# Patient Record
Sex: Male | Born: 1965 | Race: Black or African American | Hispanic: No | Marital: Married | Smoking: Never smoker
Health system: Southern US, Community
[De-identification: ages and names within clinical notes are randomized; demographics above are authoritative.]

## PROBLEM LIST (undated history)

## (undated) DIAGNOSIS — F329 Major depressive disorder, single episode, unspecified: Secondary | ICD-10-CM

## (undated) DIAGNOSIS — F419 Anxiety disorder, unspecified: Secondary | ICD-10-CM

## (undated) DIAGNOSIS — F32A Depression, unspecified: Secondary | ICD-10-CM

## (undated) DIAGNOSIS — Z889 Allergy status to unspecified drugs, medicaments and biological substances status: Secondary | ICD-10-CM

## (undated) DIAGNOSIS — D649 Anemia, unspecified: Secondary | ICD-10-CM

## (undated) HISTORY — DX: Anemia, unspecified: D64.9

## (undated) HISTORY — DX: Depression, unspecified: F32.A

## (undated) HISTORY — DX: Major depressive disorder, single episode, unspecified: F32.9

## (undated) HISTORY — DX: Anxiety disorder, unspecified: F41.9

## (undated) HISTORY — DX: Allergy status to unspecified drugs, medicaments and biological substances: Z88.9

---
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
## 1991-02-26 HISTORY — PX: TONSILLECTOMY: SUR1361

## 1992-02-26 HISTORY — PX: WISDOM TOOTH EXTRACTION: SHX21

## 2004-01-04 ENCOUNTER — Emergency Department: Payer: Self-pay | Admitting: Unknown Physician Specialty

## 2005-11-01 ENCOUNTER — Emergency Department: Payer: Self-pay

## 2006-04-03 ENCOUNTER — Emergency Department: Payer: Self-pay | Admitting: Internal Medicine

## 2007-03-25 ENCOUNTER — Emergency Department: Payer: Self-pay

## 2008-06-17 ENCOUNTER — Emergency Department: Payer: Self-pay | Admitting: Emergency Medicine

## 2012-10-04 ENCOUNTER — Emergency Department: Payer: Self-pay | Admitting: Internal Medicine

## 2012-10-04 LAB — COMPREHENSIVE METABOLIC PANEL
Anion Gap: 3 — ABNORMAL LOW (ref 7–16)
Bilirubin,Total: 0.3 mg/dL (ref 0.2–1.0)
Calcium, Total: 8.7 mg/dL (ref 8.5–10.1)
Chloride: 107 mmol/L (ref 98–107)
Co2: 29 mmol/L (ref 21–32)
Osmolality: 279 (ref 275–301)
Potassium: 4.1 mmol/L (ref 3.5–5.1)
SGOT(AST): 16 U/L (ref 15–37)
Total Protein: 8 g/dL (ref 6.4–8.2)

## 2012-10-04 LAB — CBC
HCT: 43.3 % (ref 40.0–52.0)
MCH: 29 pg (ref 26.0–34.0)
MCHC: 34.1 g/dL (ref 32.0–36.0)
MCV: 85 fL (ref 80–100)
WBC: 10.5 10*3/uL (ref 3.8–10.6)

## 2012-10-04 LAB — URINALYSIS, COMPLETE
Bacteria: NONE SEEN
Bilirubin,UR: NEGATIVE
Ketone: NEGATIVE
Nitrite: NEGATIVE
Ph: 7 (ref 4.5–8.0)
Protein: NEGATIVE
RBC,UR: 1 /HPF (ref 0–5)
Specific Gravity: 1.018 (ref 1.003–1.030)
Squamous Epithelial: NONE SEEN

## 2012-10-05 ENCOUNTER — Emergency Department: Payer: Self-pay | Admitting: Emergency Medicine

## 2015-02-10 ENCOUNTER — Ambulatory Visit: Payer: Self-pay | Admitting: Family Medicine

## 2015-02-13 ENCOUNTER — Ambulatory Visit: Payer: Self-pay | Admitting: Family Medicine

## 2015-02-15 ENCOUNTER — Telehealth: Payer: Self-pay | Admitting: Family Medicine

## 2015-02-15 NOTE — Telephone Encounter (Signed)
Pt's wife scheduled hospital f/u for 02/16/15. Pt went to [facility] on 02/13/15 and was discharged on 02/14/15 was treated for abdominal pain. Thanks TNP

## 2015-02-15 NOTE — Telephone Encounter (Signed)
FYI. KW 

## 2015-02-16 ENCOUNTER — Encounter: Payer: Self-pay | Admitting: Family Medicine

## 2015-02-16 ENCOUNTER — Ambulatory Visit (INDEPENDENT_AMBULATORY_CARE_PROVIDER_SITE_OTHER): Payer: Medicaid Other | Admitting: Family Medicine

## 2015-02-16 VITALS — HR 95 | Temp 98.4°F | Resp 18 | Wt 260.0 lb

## 2015-02-16 DIAGNOSIS — R1084 Generalized abdominal pain: Secondary | ICD-10-CM | POA: Diagnosis not present

## 2015-02-16 DIAGNOSIS — G5712 Meralgia paresthetica, left lower limb: Secondary | ICD-10-CM | POA: Insufficient documentation

## 2015-02-16 MED ORDER — PREDNISONE 20 MG PO TABS: ORAL_TABLET | ORAL | Status: DC

## 2015-02-16 NOTE — Patient Instructions (Signed)
Try two oxycodone every 6 hours for pain. Add Miralax and/or Dulcolax suppository for constipation. Call me tomorrow with how you are doing.

## 2015-02-16 NOTE — Progress Notes (Signed)
Subjective:     Patient ID: Jack Washington, male   DOB: 10-Apr-1965, 49 y.o.   MRN: [id number]  HPI  Chief Complaint  Patient presents with  . Hospitalization Follow-up    Patient comes in office today fopr hospital follow up. [facility] with complaints of lower abdominal pain radiating to his back for the past 13 days. Patient was sent home same day wiith prescription for Oxycodone, examination at hospital was clear. On 12/19 patient returned [facility] he was d/c on 12/20. Patient states that pain is around entire abdomen described as sharp radiating to his back and now pelvic region. Patient reports being constipated for 3 days.   Was not found to have a specific reason for his abdominal pain despite extensive evaluation including Abd CT scan per patient report. "I feel like it is muscular". Getting minimal relief from oxycodone 5 mg., Bentyl, and flexeril. 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.   Review of Systems  Constitutional: Negative for fever and chills.       Objective:   Physical Exam  Constitutional: He appears well-developed and well-nourished. He appears distressed (moderate pain, holding his left lower abdomen on presentation).  Abdominal: Soft. There is tenderness (generalized). There is no guarding.  Musculoskeletal:  Muscle strength in lower extremities 5/5. SLR's to 45 degrees without radiation of back pain.  Skin:  Hyperesthetic along bilateral lower back. No rash noted.       Assessment:    1. Generalized abdominal pain: ? Inflammatory muscle disorder/severe abdominal wall strain - predniSONE (DELTASONE) 20 MG tablet; Taper as follows: 3 pills for 4 days, two pills for 4 days, one pill for four days  Dispense: 24 tablet; Refill: 0    Plan:    Discussed otc medication for constipation and discussed trying oxycodone @ 10 mg. Every 6 hours. Call tomorrow regarding progress. Work excuse for 12/22-12/29. Suggested he  could stop Bentyl and muscle relaxants if not helping.

## 2015-02-21 ENCOUNTER — Ambulatory Visit (INDEPENDENT_AMBULATORY_CARE_PROVIDER_SITE_OTHER): Payer: Medicaid Other | Admitting: Family Medicine

## 2015-02-21 ENCOUNTER — Encounter: Payer: Self-pay | Admitting: Family Medicine

## 2015-02-21 VITALS — BP 106/80 | HR 72 | Temp 98.2°F | Resp 16 | Wt 269.8 lb

## 2015-02-21 DIAGNOSIS — R1084 Generalized abdominal pain: Secondary | ICD-10-CM

## 2015-02-21 NOTE — Progress Notes (Signed)
Subjective:     Patient ID: Jack Washington, male   DOB: 01-16-1966, 49 y.o.   MRN: [id number]  HPI  Chief Complaint  Patient presents with  . Abdominal Pain    Patient returns to clinic today with complaints of abdominal pain that has got worse since his last office visit on 12/22. Patient describes pain in abdomen as burning and tightness, he denies fever and states that he is back to passing bowel movements but has noticed that he has been passing more gas.   States Miralax has helped him move his bowels daily since I saw him on 12/22. No blood or melena. No new symptoms. Increasing dose of oxycodone to 10 mg. Just made him nauseous. Prednisone has not improved his sx. Subjectively to the examiner appears in less discomfort. Accompanied by his wife and young child.  Review of Systems  Constitutional: Negative for fever and chills.       Objective:   Physical Exam  Constitutional: He appears well-developed and well-nourished. He appears distressed (moderate discomfort).  Abdominal: There is tenderness (generalized). There is no guarding.       Assessment:    1. Generalized abdominal pain: ? IBD ? Internal hernia - Ambulatory referral to Gastroenterology    Plan:    Quick taper off prednisone. Reduce oxycodone back to 5 mg. Every 4-6 hours. Continue Miralax as needed.

## 2015-02-21 NOTE — Patient Instructions (Signed)
Let me know if you need more pain medication prior to your G.I. Visit.

## 2015-02-23 ENCOUNTER — Telehealth: Payer: Self-pay | Admitting: Family Medicine

## 2015-02-23 NOTE — Telephone Encounter (Signed)
Please advise 

## 2015-02-23 NOTE — Telephone Encounter (Signed)
Tammy (pt wife) calling stating pt needs another note to be out of work next week due abdomen pain. Thanks CC

## 2015-02-24 ENCOUNTER — Telehealth: Payer: Self-pay | Admitting: Family Medicine

## 2015-02-24 NOTE — Telephone Encounter (Signed)
Patient Jack Washington(Tammy) wife call and is requesting if you would extend patient's work note, he is still in pain and cannot work. She also states that his colonoscopy is not until February wanted to know if it can be moved up some, she do not think patient came go that long. She was wanting some advice. Cb# [phone] (Tammy)

## 2015-02-24 NOTE — Telephone Encounter (Signed)
Work excuse provided 12/30-1/6. He was provided a new medication per Tammy but has not started it yet.Will see how this works. If no improvement may need to push up colonoscopy time.

## 2015-03-07 ENCOUNTER — Telehealth: Payer: Self-pay

## 2015-03-07 NOTE — Telephone Encounter (Signed)
Tried calling [facility] dept [phone]( [phone]) 2 times and line was busy. Will try again later.

## 2015-03-07 NOTE — Telephone Encounter (Signed)
Would like to see the [facility] consult from 12/29 first.Can you ask them to fax it to us.

## 2015-03-07 NOTE — Telephone Encounter (Signed)
Patient called wanting to know if he can have an extension before he goes back to work. Patient is wanting to start back on next week 03/13/15. Ok to write note? Please advise. Thanks!

## 2015-03-08 NOTE — Telephone Encounter (Signed)
Awaiting fax report.KW

## 2015-03-08 NOTE — Telephone Encounter (Signed)
Spoke with Lupita Leashonna at [facility]. Per Lupita Leashonna she will send a message to the nurse to fax consult to us.

## 2015-03-09 ENCOUNTER — Telehealth: Payer: Self-pay

## 2015-03-09 NOTE — Telephone Encounter (Signed)
-----   Message from Anola Gurneyobert Chauvin, [place] sent at 03/08/2015  1:20 PM EST ----- Let him know work excuse is up front for pickup.

## 2015-03-09 NOTE — Telephone Encounter (Signed)
Voicemail box was full, sent SMS to phone for patient to call our number back. KW

## 2015-03-10 NOTE — Telephone Encounter (Signed)
Patient voicemail box is full sent SMS message to phone to contact office at [phone].KW

## 2015-03-10 NOTE — Telephone Encounter (Signed)
Patients wife called back office and I advised her that work not is available to pick up at front desk, she states that she will be picking up note for patient this afternoon. KW

## 2015-03-17 ENCOUNTER — Telehealth: Payer: Self-pay | Admitting: Emergency Medicine

## 2015-03-17 NOTE — Telephone Encounter (Signed)
[id number] 

## 2015-03-17 NOTE — Telephone Encounter (Signed)
Work excuse for 1/16-1/20 is up front for pickup. When is the colonoscopy scheduled?

## 2015-03-17 NOTE — Telephone Encounter (Signed)
Pt wife and needs his work excuse for this week. She said you knew about this and had been doing it for pt. Thanks.    CB#[phone]

## 2015-03-20 NOTE — Telephone Encounter (Signed)
[id number] 

## 2015-03-21 NOTE — Telephone Encounter (Signed)
LMTCB 03/21/2015  Thanks,   -Donyelle Enyeart  

## 2015-03-22 NOTE — Telephone Encounter (Signed)
NA. sd 

## 2015-03-24 NOTE — Telephone Encounter (Signed)
Unable to reach patient after multiple times, will mail letter to home. KW

## 2015-03-31 ENCOUNTER — Encounter: Payer: Self-pay | Admitting: *Deleted

## 2015-04-03 ENCOUNTER — Encounter: Payer: Self-pay | Admitting: Anesthesiology

## 2015-04-03 ENCOUNTER — Ambulatory Visit: Payer: Medicaid Other | Admitting: Anesthesiology

## 2015-04-03 ENCOUNTER — Ambulatory Visit
Admission: RE | Admit: 2015-04-03 | Discharge: 2015-04-03 | Disposition: A | Discharge status: Home / Self Care | Payer: Medicaid Other | Source: Ambulatory Visit | Attending: Gastroenterology | Admitting: Gastroenterology

## 2015-04-03 ENCOUNTER — Encounter
Admission: RE | Disposition: A | Discharge status: Home / Self Care | Payer: Self-pay | Source: Ambulatory Visit | Attending: Gastroenterology

## 2015-04-03 DIAGNOSIS — K5730 Diverticulosis of large intestine without perforation or abscess without bleeding: Secondary | ICD-10-CM | POA: Insufficient documentation

## 2015-04-03 DIAGNOSIS — G709 Myoneural disorder, unspecified: Secondary | ICD-10-CM | POA: Insufficient documentation

## 2015-04-03 DIAGNOSIS — Z7952 Long term (current) use of systemic steroids: Secondary | ICD-10-CM | POA: Insufficient documentation

## 2015-04-03 DIAGNOSIS — Z800 Family history of malignant neoplasm of digestive organs: Secondary | ICD-10-CM | POA: Diagnosis not present

## 2015-04-03 DIAGNOSIS — E669 Obesity, unspecified: Secondary | ICD-10-CM | POA: Insufficient documentation

## 2015-04-03 DIAGNOSIS — R1084 Generalized abdominal pain: Secondary | ICD-10-CM | POA: Diagnosis present

## 2015-04-03 DIAGNOSIS — F419 Anxiety disorder, unspecified: Secondary | ICD-10-CM | POA: Diagnosis not present

## 2015-04-03 DIAGNOSIS — Z79899 Other long term (current) drug therapy: Secondary | ICD-10-CM | POA: Insufficient documentation

## 2015-04-03 DIAGNOSIS — Z6841 Body Mass Index (BMI) 40.0 and over, adult: Secondary | ICD-10-CM | POA: Insufficient documentation

## 2015-04-03 DIAGNOSIS — F329 Major depressive disorder, single episode, unspecified: Secondary | ICD-10-CM | POA: Diagnosis not present

## 2015-04-03 DIAGNOSIS — K640 First degree hemorrhoids: Secondary | ICD-10-CM | POA: Insufficient documentation

## 2015-04-03 DIAGNOSIS — R1030 Lower abdominal pain, unspecified: Secondary | ICD-10-CM | POA: Insufficient documentation

## 2015-04-03 HISTORY — PX: COLONOSCOPY WITH PROPOFOL: SHX5780

## 2015-04-03 SURGERY — COLONOSCOPY WITH PROPOFOL
Anesthesia: General

## 2015-04-03 MED ORDER — PROPOFOL 500 MG/50ML IV EMUL
INTRAVENOUS | Status: DC | PRN
  Administered 2015-04-03: 150 ug/kg/min via INTRAVENOUS

## 2015-04-03 MED ORDER — SODIUM CHLORIDE 0.9 % IV SOLN: INTRAVENOUS | Status: DC

## 2015-04-03 MED ORDER — PROPOFOL 10 MG/ML IV BOLUS
INTRAVENOUS | Status: DC | PRN
  Administered 2015-04-03: 100 mg via INTRAVENOUS

## 2015-04-03 MED ORDER — SODIUM CHLORIDE 0.9 % IV SOLN
INTRAVENOUS | Status: DC
  Administered 2015-04-03: 1000 mL via INTRAVENOUS

## 2015-04-03 NOTE — Anesthesia Preprocedure Evaluation (Signed)
Anesthesia Evaluation  Patient identified by MRN, date of birth, ID band Patient awake    Reviewed: Allergy & Precautions, H&P , NPO status , Patient's Chart, lab work & pertinent test results  History of Anesthesia Complications Negative for: history of anesthetic complications  Airway Mallampati: III  TM Distance: >3 FB Neck ROM: full    Dental  (+) Poor Dentition   Pulmonary neg pulmonary ROS, neg shortness of breath,    Pulmonary exam normal breath sounds clear to auscultation       Cardiovascular Exercise Tolerance: Good (-) angina(-) Past MI and (-) DOE Normal cardiovascular exam Rhythm:regular Rate:Normal     Neuro/Psych PSYCHIATRIC DISORDERS Anxiety Depression  Neuromuscular disease    GI/Hepatic negative GI ROS, Neg liver ROS, neg GERD  ,  Endo/Other  negative endocrine ROS  Renal/GU negative Renal ROS  negative genitourinary   Musculoskeletal   Abdominal   Peds  Hematology negative hematology ROS (+)   Anesthesia Other Findings Past Medical History:   History of allergy                                           Anemia                                                       Depression                                                   Anxiety                                                     Signs and symptoms suggestive of sleep apnea    Past Surgical History:   APPENDECTOMY                                     1985         WISDOM TOOTH EXTRACTION                          1994         TONSILLECTOMY                                    1993        BMI    Body Mass Index   41.06 kg/m 2      Reproductive/Obstetrics negative OB ROS                             Anesthesia Physical Anesthesia Plan  ASA: III  Anesthesia Plan: General   Post-op Pain Management:    Induction:   Airway Management Planned:   Additional Equipment:   Intra-op  Plan:   Post-operative Plan:    Informed Consent: I have reviewed the patients History and Physical, chart, labs and discussed the procedure including the risks, benefits and alternatives for the proposed anesthesia with the patient or authorized representative who has indicated his/her understanding and acceptance.   Dental Advisory Given  Plan Discussed with: Anesthesiologist, CRNA and Surgeon  Anesthesia Plan Comments:         Anesthesia Quick Evaluation

## 2015-04-03 NOTE — Transfer of Care (Signed)
Immediate Anesthesia Transfer of Care Note  Patient: Jack Washington  Procedure(s) Performed: Procedure(s): COLONOSCOPY WITH PROPOFOL (N/A)  Patient Location: Endoscopy Unit  Anesthesia Type:General  Level of Consciousness: sedated  Airway & Oxygen Therapy: Patient Spontanous Breathing and Patient connected to nasal cannula oxygen  Post-op Assessment: Report given to RN and Post -op Vital signs reviewed and stable  Post vital signs: Reviewed and stable  Last Vitals:  Filed Vitals:   04/03/15 0748  BP: 132/100  Pulse: 98  Temp: 36.3 C  Resp: 16    Complications: No apparent anesthesia complications

## 2015-04-03 NOTE — Anesthesia Postprocedure Evaluation (Deleted)
Anesthesia Post Note  Patient: Jack Washington  Procedure(s) Performed: Procedure(s) (LRB): COLONOSCOPY WITH PROPOFOL (N/A)  Patient location during evaluation: Endoscopy Anesthesia Type: General Level of consciousness: awake and alert Pain management: pain level controlled Vital Signs Assessment: post-procedure vital signs reviewed and stable Respiratory status: spontaneous breathing, nonlabored ventilation, respiratory function stable and patient connected to nasal cannula oxygen Cardiovascular status: blood pressure returned to baseline and stable Postop Assessment: no signs of nausea or vomiting Anesthetic complications: no    Last Vitals:  Filed Vitals:   04/03/15 0748 04/03/15 0856  BP: 132/100   Pulse: 98   Temp: 36.3 C 36 C  Resp: 16     Last Pain:  Filed Vitals:   04/03/15 0858  PainSc: Asleep                 Cleda Mccreedy Jerimie Mancuso

## 2015-04-03 NOTE — Anesthesia Postprocedure Evaluation (Signed)
Anesthesia Post Note  Patient: Jack Washington  Procedure(s) Performed: Procedure(s) (LRB): COLONOSCOPY WITH PROPOFOL (N/A)  Patient location during evaluation: Endoscopy Anesthesia Type: General Level of consciousness: awake and alert Pain management: pain level controlled Vital Signs Assessment: post-procedure vital signs reviewed and stable Respiratory status: spontaneous breathing, nonlabored ventilation, respiratory function stable and patient connected to nasal cannula oxygen Cardiovascular status: blood pressure returned to baseline and stable Postop Assessment: no signs of nausea or vomiting Anesthetic complications: no    Last Vitals:  Filed Vitals:   04/03/15 0926 04/03/15 0936  BP: 121/105 136/99  Pulse: 81 76  Temp:    Resp: 10 14    Last Pain:  Filed Vitals:   04/03/15 0950  PainSc: Asleep                 Cleda Mccreedy Aisha Greenberger

## 2015-04-03 NOTE — H&P (Signed)
Outpatient short stay form Pre-procedure 04/03/2015 8:25 AM Christena Deem MD  Primary Physician: Dr. Aram Beecham  Reason for visit:  Colonoscopy  History of present illness:  Patient is a 50 year old male presenting today for colonoscopy. He has had no change of bowel habits however for the past 6-8 weeks he has had intermittent problems with generalized abdominal pain. This may at times awaken him from sleeping. He does have a family history of colon cancer primary relative. He's never had a colonoscopy before. There is no nausea or vomiting. The discomfort is basically in the lower abdomen. It seems to start on the flanks and radiated inward. It is not epigastric. He is seen no black stool or blood in the stool.    Current facility-administered medications:  .  0.9 %  sodium chloride infusion, , Intravenous, Continuous, Christena Deem, MD, Last Rate: 20 mL/hr at 04/03/15 0801, 1,000 mL at 04/03/15 0801 .  0.9 %  sodium chloride infusion, , Intravenous, Continuous, Christena Deem, MD  Prescriptions prior to admission  Medication Sig Dispense Refill Last Dose  . amitriptyline (ELAVIL) 25 MG tablet Take 25 mg by mouth at bedtime as needed for sleep.   04/02/2015 at Unknown time  . cyclobenzaprine (FLEXERIL) 10 MG tablet Take by mouth.   04/02/2015 at Unknown time  . dicyclomine (BENTYL) 20 MG tablet Take by mouth.   04/02/2015 at Unknown time  . oxyCODONE-acetaminophen (PERCOCET/ROXICET) 5-325 MG tablet TK 1 T PO  Q 4 H PRN P FOR UP TO 7 DAYS  0 04/02/2015 at Unknown time  . predniSONE (DELTASONE) 20 MG tablet Taper as follows: 3 pills for 4 days, two pills for 4 days, one pill for four days (Patient not taking: Reported on 04/03/2015) 24 tablet 0 Completed Course     Allergies  Allergen Reactions  . Sulfa Antibiotics      Past Medical History  Diagnosis Date  . History of allergy   . Anemia   . Depression   . Anxiety     Review of systems:      Physical Exam    Heart and  lungs: Regular rate and rhythm without rub or gallop, lungs are bilaterally clear.    HEENT: Norm cephalic atraumatic eyes are anicteric    Other:     Pertinant exam for procedure: Soft obese, mild discomfort generally mostly in the lower abdomen or no masses or rebound. Bowel sounds positive normoactive.    Planned proceedures: Colonoscopy and indicated procedures. I have discussed the risks benefits and complications of procedures to include not limited to bleeding, infection, perforation and the risk of sedation and the patient wishes to proceed.    Christena Deem, MD Gastroenterology 04/03/2015  8:25 AM

## 2015-04-03 NOTE — Op Note (Signed)
[facility] Gastroenterology Patient Name: Jack Washington Procedure Date: 04/03/2015 8:27 AM MRN: [id number] Account #: [id number] Date of Birth: 1965/05/20 Admit Type: Outpatient Age: 50 Room: [facility] ENDO ROOM 3 Gender: Male Note Status: Finalized Procedure:         Colonoscopy Indications:       Generalized abdominal pain, Lower abdominal pain, Family                     history of colon cancer in a first-degree relative Providers:         Christena Deem, MD Referring MD:      Hassell Halim, MD (Referring MD) Medicines:         Monitored Anesthesia Care Complications:     No immediate complications. Procedure:         Pre-Anesthesia Assessment:                    - ASA Grade Assessment: III - A patient with severe                     systemic disease.                    After obtaining informed consent, the colonoscope was                     passed under direct vision. Throughout the procedure, the                     patient's blood pressure, pulse, and oxygen saturations                     were monitored continuously. The Colonoscope was                     introduced through the anus and advanced to the the cecum,                     identified by appendiceal orifice and ileocecal valve. The                     colonoscopy was performed with moderate difficulty.                     Successful completion of the procedure was aided by                     changing the patient to a prone position and using manual                     pressure. The quality of the bowel preparation was good. Findings:      Multiple medium-mouthed diverticula were found in the sigmoid colon and       in the descending colon.      Multiple medium-mouthed diverticula were found in the proximal sigmoid       colon and in the mid sigmoid colon. Petechia were visualized in       association with the diverticular opening.      The digital rectal exam was normal.   Non-bleeding internal hemorrhoids were found during retroflexion. The       hemorrhoids were Grade I (internal hemorrhoids that do not prolapse).      No additional abnormalities were found on retroflexion. Impression:        -  Diverticulosis in the sigmoid colon and in the                     descending colon.                    - Mild diverticulosis in the proximal sigmoid colon and in                     the mid sigmoid colon. Petechia were visualized in                     association with the diverticular opening.                    - Non-bleeding internal hemorrhoids.                    - No specimens collected. Recommendation:    - Discharge patient to home.                    - Cipro (ciprofloxacin) 500 mg PO BID for 1 week.                    - Flagyl (metronidazole) 250 mg PO QID for 1 week.                    - Return to GI clinic in 1 month. Procedure Code(s): --- Professional ---                    [phone], Colonoscopy, flexible; diagnostic, including                     collection of specimen(s) by brushing or washing, when                     performed (separate procedure) Diagnosis Code(s): --- Professional ---                    K64.0, First degree hemorrhoids                    R10.84, Generalized abdominal pain                    R10.30, Lower abdominal pain, unspecified                    Z80.0, Family history of malignant neoplasm of digestive                     organs                    K57.30, Diverticulosis of large intestine without                     perforation or abscess without bleeding CPT copyright 2014 American Medical Association. All rights reserved. The codes documented in this report are preliminary and upon coder review may  be revised to meet current compliance requirements. Christena Deem, MD 04/03/2015 8:55:23 AM This report has been signed electronically. Number of Addenda: 0 Note Initiated On: 04/03/2015 8:27 AM Scope Withdrawal Time: 0  hours 4 minutes 20 seconds  Total Procedure Duration: 0 hours 14 minutes 34 seconds       [facility]

## 2015-04-04 ENCOUNTER — Encounter: Payer: Self-pay | Admitting: Gastroenterology

## 2015-04-13 ENCOUNTER — Encounter: Payer: Medicaid Other | Admitting: Physician Assistant

## 2015-04-13 ENCOUNTER — Encounter: Payer: Self-pay | Admitting: Physician Assistant

## 2015-04-13 ENCOUNTER — Encounter: Payer: Self-pay | Admitting: Family Medicine

## 2015-04-13 NOTE — Progress Notes (Deleted)
Patient ID: Jack Washington, male   DOB: [birth year], 50 y.o.   MRN: [id number]       Patient: Jack Washington Male    DOB: 04/02/1965   50 y.o.   MRN: [id number] Visit Date: 04/13/2015  Today's Provider: Margaretann Loveless, PA-C   Chief Complaint  Patient presents with  . Diverticulosis    Follow up from Colonoscopy with Dr. Barnetta Chapel   Subjective:    Abdominal Pain This is a recurrent problem. Associated symptoms include belching, constipation, flatus and nausea. Nothing aggravates the pain. The pain is relieved by passing flatus. He has tried antibiotics for the symptoms.  Patient returns to office today for follow up, patient had scheduled colonoscopy on 04/03/15. Colonoscopy showed diverticulosis in the sigmoid colon and in the descending colon. Non bleeding internal hemorrhoids were found during retroflexion, patient was prescribed Cipro  and Flagyl .      Allergies  Allergen Reactions  . Sulfa Antibiotics    Previous Medications   AMITRIPTYLINE (ELAVIL) 25 MG TABLET    Take 25 mg by mouth at bedtime as needed for sleep.   CYCLOBENZAPRINE (FLEXERIL) 10 MG TABLET    Take by mouth.   DICYCLOMINE (BENTYL) 20 MG TABLET    Take by mouth.   METRONIDAZOLE (FLAGYL) 250 MG TABLET       OXYCODONE-ACETAMINOPHEN (PERCOCET/ROXICET) 5-325 MG TABLET    TK 1 T PO  Q 4 H PRN P FOR UP TO 7 DAYS   POLYETHYLENE GLYCOL POWDER (GLYCOLAX/MIRALAX) POWDER       PREDNISONE (DELTASONE) 20 MG TABLET    Taper as follows: 3 pills for 4 days, two pills for 4 days, one pill for four days    Review of Systems  Gastrointestinal: Positive for nausea, abdominal pain, constipation and flatus.    Social History  Substance Use Topics  . Smoking status: Never Smoker   . Smokeless tobacco: Never Used  . Alcohol Use: No   Objective:   There were no vitals taken for this visit.  Physical Exam      Assessment & Plan:           Margaretann Loveless, PA-C  [facility]

## 2015-04-13 NOTE — Progress Notes (Deleted)
Subjective:     Patient ID: Jack Washington, male   DOB: [birth year], 50 y.o.   MRN: [id number]  HPI   Review of Systems     Objective:   Physical Exam     Assessment:     ***    Plan:     ***

## 2015-04-14 ENCOUNTER — Encounter: Payer: Self-pay | Admitting: Family Medicine

## 2015-04-14 ENCOUNTER — Ambulatory Visit (INDEPENDENT_AMBULATORY_CARE_PROVIDER_SITE_OTHER): Payer: Medicaid Other | Admitting: Family Medicine

## 2015-04-14 VITALS — BP 120/76 | HR 88 | Temp 98.0°F | Resp 16 | Wt 272.8 lb

## 2015-04-14 DIAGNOSIS — F430 Acute stress reaction: Secondary | ICD-10-CM | POA: Diagnosis not present

## 2015-04-14 DIAGNOSIS — R1084 Generalized abdominal pain: Secondary | ICD-10-CM | POA: Diagnosis not present

## 2015-04-14 MED ORDER — DIAZEPAM 5 MG PO TABS: 5.0000 mg | ORAL_TABLET | Freq: Three times a day (TID) | ORAL | Status: DC | PRN

## 2015-04-14 NOTE — Patient Instructions (Signed)
F/u with G.I.as scheduled. Show him the results of you stool tests.

## 2015-04-14 NOTE — Progress Notes (Signed)
Subjective:     Patient ID: Jack Washington, male   DOB: 1966-01-13, 50 y.o.   MRN: [id number]  HPI  Chief Complaint  Patient presents with  . Follow-up    Patient comes in office today for follow up visit, patient still has complaints of abdominal pain today. Patient recently had colonoscopy with Dr.Skulskie on 04/03/15, findings from the colonoscopy showed multiple medium diverticula which was found in the proximal sigmoid. Impresion from exam showed diverticulosis in the sigmoid colon and in the descending colon and non bleeding internal hemorrhoids were found during retroflexion. Patiet was treated with Cipro  and Flagyl   States he has f/u with G.I. Dr. Ricki Rodriguez, in early March. Also noted ?of worms in his stool. He took a sample to a vet and was noted to have a tapeworm and Giardia cysts. He will submit these results to G.I.when he sees them. In general intensity of abdominal pain has decreased since onset in early December of last year. He now requires Tyenol for discomfort. Notes stress will exacerbate (family member ill and taxes) and feels at times it radiates from his back to both sides of his abdomen. Accompanied by his wife today.   Review of Systems     Objective:   Physical Exam  Constitutional: He appears well-developed and well-nourished. No distress.  Abdominal: Soft. There is no tenderness.  Musculoskeletal:  SLR's without back pain to 75 degrees.       Assessment:    1. Generalized abdominal pain  2. Reaction, situational, acute, to stress: Will see if this medication ameliorates his abdominal pain. - diazepam (VALIUM) 5 MG tablet; Take 1 tablet (5 mg total) by mouth every 8 (eight) hours as needed for anxiety.  Dispense: 30 tablet; Refill: 0    Plan:    F/u with G.I.as scheduled and complete abx.

## 2015-04-14 NOTE — Progress Notes (Signed)
A user error has taken place: encounter opened in error, closed for administrative reasons.

## 2015-05-02 ENCOUNTER — Telehealth: Payer: Self-pay | Admitting: Family Medicine

## 2015-05-02 NOTE — Telephone Encounter (Signed)
Pt's wife Tammy stated pt was supposed to call Nadine CountsBob after his GI appt. Tammy wanted to let Nadine CountsBob know they went to the appt and Nadine CountsBob can call pt when he gets a chance. Thanks TNP

## 2015-05-02 NOTE — Telephone Encounter (Signed)
FYI

## 2015-05-03 ENCOUNTER — Other Ambulatory Visit: Payer: Self-pay | Admitting: Family Medicine

## 2015-05-03 ENCOUNTER — Telehealth: Payer: Self-pay

## 2015-05-03 ENCOUNTER — Encounter: Payer: Self-pay | Admitting: Family Medicine

## 2015-05-03 DIAGNOSIS — F439 Reaction to severe stress, unspecified: Secondary | ICD-10-CM

## 2015-05-03 DIAGNOSIS — K589 Irritable bowel syndrome without diarrhea: Secondary | ICD-10-CM | POA: Insufficient documentation

## 2015-05-03 MED ORDER — ESCITALOPRAM OXALATE 10 MG PO TABS: 10.0000 mg | ORAL_TABLET | Freq: Every day | ORAL | Status: DC

## 2015-05-03 NOTE — Telephone Encounter (Signed)
Discussed: G.I.note pending.

## 2015-05-03 NOTE — Telephone Encounter (Signed)
[id number] 

## 2015-05-03 NOTE — Telephone Encounter (Signed)
-----   Message from Anola Gurneyobert Chauvin, [place] sent at 05/03/2015 10:43 AM EST ----- Let him know I have reviewed the G.I. Note. I have sent in a prescription for generic Lexapro. Start at 1/2 pill for the first week. I would want to see him in 2-3 weeks. This medication will help with stress and may help calm down his stomach as well. You can continue to use diazepam while we get you started on Lexapro.

## 2015-05-05 NOTE — Telephone Encounter (Signed)
Patient has been advised. KW 

## 2015-05-11 ENCOUNTER — Telehealth: Payer: Self-pay | Admitting: Family Medicine

## 2015-05-11 NOTE — Telephone Encounter (Signed)
Discussed with patient. Letter completed stating he can return to full duty.

## 2015-05-11 NOTE — Telephone Encounter (Signed)
Pt and pt's brother called and would like a letter on office letter head stating that pt is on light duty. Pt is requesting the letter so he can get unemployment. Pt's brother stated they needed the letter today b/c they haven't worked in 3 months and it would take another 6 weeks to get the check. I advised that it might not be available to be done today. Please advise. Thanks TNP

## 2015-05-11 NOTE — Telephone Encounter (Signed)
Please Review. KW 

## 2018-03-02 ENCOUNTER — Encounter: Payer: Self-pay | Admitting: Emergency Medicine

## 2018-03-02 ENCOUNTER — Emergency Department
Admission: EM | Admit: 2018-03-02 | Discharge: 2018-03-02 | Disposition: A | Discharge status: Home / Self Care | Payer: Self-pay | Attending: Emergency Medicine | Admitting: Emergency Medicine

## 2018-03-02 ENCOUNTER — Other Ambulatory Visit: Payer: Self-pay

## 2018-03-02 DIAGNOSIS — Y990 Civilian activity done for income or pay: Secondary | ICD-10-CM | POA: Insufficient documentation

## 2018-03-02 DIAGNOSIS — X500XXA Overexertion from strenuous movement or load, initial encounter: Secondary | ICD-10-CM | POA: Insufficient documentation

## 2018-03-02 DIAGNOSIS — S29012A Strain of muscle and tendon of back wall of thorax, initial encounter: Secondary | ICD-10-CM | POA: Insufficient documentation

## 2018-03-02 DIAGNOSIS — Z79899 Other long term (current) drug therapy: Secondary | ICD-10-CM | POA: Insufficient documentation

## 2018-03-02 DIAGNOSIS — Y9289 Other specified places as the place of occurrence of the external cause: Secondary | ICD-10-CM | POA: Insufficient documentation

## 2018-03-02 DIAGNOSIS — S46912A Strain of unspecified muscle, fascia and tendon at shoulder and upper arm level, left arm, initial encounter: Secondary | ICD-10-CM

## 2018-03-02 DIAGNOSIS — Y9389 Activity, other specified: Secondary | ICD-10-CM | POA: Insufficient documentation

## 2018-03-02 MED ORDER — LIDOCAINE 5 % EX PTCH: 1.0000 | MEDICATED_PATCH | Freq: Two times a day (BID) | CUTANEOUS | 0 refills | Status: DC

## 2018-03-02 MED ORDER — OXYCODONE-ACETAMINOPHEN 7.5-325 MG PO TABS: 1.0000 | ORAL_TABLET | Freq: Four times a day (QID) | ORAL | 0 refills | Status: DC | PRN

## 2018-03-02 MED ORDER — METHOCARBAMOL 750 MG PO TABS: 750.0000 mg | ORAL_TABLET | Freq: Four times a day (QID) | ORAL | 0 refills | Status: DC

## 2018-03-02 NOTE — ED Notes (Signed)
See triage note  Presents with pain to upper back  Pain is mainly left posterior shoulder and increases with movement and inspiration  States it started on Friday after pulling something at work

## 2018-03-02 NOTE — ED Provider Notes (Signed)
[facility] Emergency Department Provider Note   ____________________________________________   First MD Initiated Contact with Patient 03/02/18 1133     (approximate)  I have reviewed the triage vital signs and the nursing notes.   HISTORY  Chief Complaint Back Pain    HPI Jack Washington is a 53 y.o. male patient presents with 3 days of left upper back pain.  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  Patient rates his pain as 8/10.  Patient described pain is "achy".  No relief over-the-counter pain patches and anti-inflammatory medications.  Past Medical History:  Diagnosis Date  . Anemia   . Anxiety   . Depression   . History of allergy     Patient Active Problem List   Diagnosis Date Noted  . IBS (irritable bowel syndrome) 05/03/2015  . Generalized abdominal pain 04/14/2015  . Meralgia paresthetica of left side 02/16/2015    Past Surgical History:  Procedure Laterality Date  . APPENDECTOMY  1985  . COLONOSCOPY WITH PROPOFOL N/A 04/03/2015   Procedure: COLONOSCOPY WITH PROPOFOL;  Surgeon: Christena DeemMartin U Skulskie, MD;  Location: [facility] ENDOSCOPY;  Service: Endoscopy;  Laterality: N/A;  . TONSILLECTOMY  1993  . WISDOM TOOTH EXTRACTION  1994    Prior to Admission medications   Medication Sig Start Date End Date Taking? Authorizing Provider  diazepam (VALIUM) 5 MG tablet Take 1 tablet (5 mg total) by mouth every 8 (eight) hours as needed for anxiety. 04/14/15   Anola Gurneyhauvin, Robert, PA  escitalopram (LEXAPRO) 10 MG tablet Take 1 tablet (10 mg total) by mouth daily. Start at 1/2 pill for the first week 05/03/15   Anola Gurneyhauvin, Robert, PA  lidocaine (LIDODERM) 5 % Place 1 patch onto the skin every 12 (twelve) hours. Remove & Discard patch within 12 hours or as directed by MD 03/02/18 03/02/19  Joni ReiningSmith, Ronald K, PA-C  methocarbamol (ROBAXIN-750) 750 MG tablet Take 1 tablet (750 mg total) by mouth 4 (four) times daily. 03/02/18    Joni ReiningSmith, Ronald K, PA-C  metroNIDAZOLE (FLAGYL) 250 MG tablet  04/03/15   [provider]  oxyCODONE-acetaminophen (PERCOCET) 7.5-325 MG tablet Take 1 tablet by mouth every 6 (six) hours as needed for severe pain. 03/02/18   Joni ReiningSmith, Ronald K, PA-C  polyethylene glycol powder [facility](GLYCOLAX/MIRALAX) powder  02/23/15   [provider]    Allergies Amitriptyline and Sulfa antibiotics  Family History  Problem Relation Age of Onset  . Panic disorder Mother   . Healthy Daughter   . Healthy Son   . Heart disease Maternal Grandmother        had stent  . Heart failure Maternal Grandmother   . Cancer Paternal Grandfather   . Healthy Son     Social History Social History   Tobacco Use  . Smoking status: Never Smoker  . Smokeless tobacco: Never Used  Substance Use Topics  . Alcohol use: No    Alcohol/week: 0.0 standard drinks  . Drug use: Not on file    Review of Systems  Constitutional: No fever/chills Eyes: No visual changes. ENT: No sore throat. Cardiovascular: Denies chest pain. Respiratory: Denies shortness of breath. Gastrointestinal: No abdominal pain.  No nausea, no vomiting.  No diarrhea.  No constipation. Genitourinary: Negative for dysuria. Musculoskeletal: Left upper back pain. Skin: Negative for rash. Neurological: Negative for headaches, focal weakness or numbness. Psychiatric:Anxiety depression. Endocrine: Allergic/Immunilogical: Amitriptyline and sulfa medications. ____________________________________________   PHYSICAL EXAM:  VITAL SIGNS:  ED Triage Vitals  Enc Vitals Group     BP 03/02/18 1104 (!) 161/98     Pulse Rate 03/02/18 1104 98     Resp 03/02/18 1104 16     Temp 03/02/18 1104 98.8 F (37.1 C)     Temp Source 03/02/18 1104 Oral     SpO2 03/02/18 1104 96 %     Weight 03/02/18 1102 270 lb (122.5 kg)     Height 03/02/18 1102 5\' 8"  (1.727 m)     Head Circumference --      Peak Flow --      Pain Score 03/02/18 1101 8     Pain Loc --       Pain Edu? --      Excl. in GC? --    Constitutional: Alert and oriented. Well appearing and in no acute distress. Neck: No stridor.  No cervical spine tenderness to palpation. Hematological/Lymphatic/Immunilogical: No cervical lymphadenopathy. Cardiovascular: Normal rate, regular rhythm. Grossly normal heart sounds.  Good peripheral circulation. Respiratory: Normal respiratory effort.  No retractions. Lungs CTAB. Musculoskeletal: No obvious deformity to the left upper extremity.  Patient has decreased range of motion abduction overhead reaching left upper extremity.  Moderate guarding palpation left mid scapulary muscle area. Neurologic:  Normal speech and language. No gross focal neurologic deficits are appreciated. No gait instability. Skin:  Skin is warm, dry and intact. No rash noted. Psychiatric: Mood and affect are normal. Speech and behavior are normal.  ____________________________________________   LABS (all labs ordered are listed, but only abnormal results are displayed)  Labs Reviewed - No data to display ____________________________________________  EKG   ____________________________________________  RADIOLOGY  ED MD interpretation:    Official radiology report(s): No results found.  ____________________________________________   PROCEDURES  Procedure(s) performed: None  Procedures  Critical Care performed: No  ____________________________________________   INITIAL IMPRESSION / ASSESSMENT AND PLAN / ED COURSE  As part of my medical decision making, I reviewed the following data within the electronic MEDICAL RECORD NUMBER    Upper back pain secondary to scapular strain.  Patient given discharge care instruction.  Patient advised take medication as directed.  Patient given a work note advised no driving while taking pain medications.  Advised to follow-up PCP if condition persist.      ____________________________________________   FINAL  CLINICAL IMPRESSION(S) / ED DIAGNOSES  Final diagnoses:  Muscle strain of left scapular region, initial encounter     ED Discharge Orders         Ordered    methocarbamol (ROBAXIN-750) 750 MG tablet  4 times daily     03/02/18 1155    lidocaine (LIDODERM) 5 %  Every 12 hours     03/02/18 1155    oxyCODONE-acetaminophen (PERCOCET) 7.5-325 MG tablet  Every 6 hours PRN     03/02/18 1155           Note:  This document was prepared using Dragon voice recognition software and may include unintentional dictation errors.    Joni Reining, PA-C 03/02/18 1200    Sharman Cheek, MD 03/09/18 [phone]

## 2018-07-27 ENCOUNTER — Other Ambulatory Visit: Payer: Self-pay

## 2018-07-27 ENCOUNTER — Telehealth: Payer: Self-pay | Admitting: *Deleted

## 2018-07-27 DIAGNOSIS — Z20822 Contact with and (suspected) exposure to covid-19: Secondary | ICD-10-CM

## 2018-07-27 NOTE — Telephone Encounter (Signed)
[facility] Dept  Request testing due to + exposure in home and symptoms- fever, fatigue

## 2018-07-28 ENCOUNTER — Other Ambulatory Visit: Payer: Self-pay | Admitting: *Deleted

## 2018-07-28 DIAGNOSIS — Z20822 Contact with and (suspected) exposure to covid-19: Secondary | ICD-10-CM

## 2018-07-30 LAB — NOVEL CORONAVIRUS, NAA: SARS-CoV-2, NAA: DETECTED — AB

## 2018-07-31 ENCOUNTER — Encounter: Payer: Self-pay | Admitting: Emergency Medicine

## 2018-07-31 ENCOUNTER — Emergency Department
Admission: EM | Admit: 2018-07-31 | Discharge: 2018-07-31 | Disposition: A | Discharge status: Home / Self Care | Payer: Self-pay | Attending: Emergency Medicine | Admitting: Emergency Medicine

## 2018-07-31 ENCOUNTER — Other Ambulatory Visit: Payer: Self-pay

## 2018-07-31 ENCOUNTER — Emergency Department: Payer: Self-pay

## 2018-07-31 DIAGNOSIS — W57XXXA Bitten or stung by nonvenomous insect and other nonvenomous arthropods, initial encounter: Secondary | ICD-10-CM

## 2018-07-31 DIAGNOSIS — U071 COVID-19: Secondary | ICD-10-CM | POA: Insufficient documentation

## 2018-07-31 DIAGNOSIS — Z79899 Other long term (current) drug therapy: Secondary | ICD-10-CM | POA: Insufficient documentation

## 2018-07-31 DIAGNOSIS — I10 Essential (primary) hypertension: Secondary | ICD-10-CM | POA: Insufficient documentation

## 2018-07-31 DIAGNOSIS — Z20818 Contact with and (suspected) exposure to other bacterial communicable diseases: Secondary | ICD-10-CM | POA: Insufficient documentation

## 2018-07-31 LAB — COMPREHENSIVE METABOLIC PANEL
ALT: 26 U/L (ref 0–44)
AST: 23 U/L (ref 15–41)
Albumin: 3.9 g/dL (ref 3.5–5.0)
Alkaline Phosphatase: 39 U/L (ref 38–126)
Anion gap: 9 (ref 5–15)
BUN: 13 mg/dL (ref 6–20)
CO2: 23 mmol/L (ref 22–32)
Calcium: 8.6 mg/dL — ABNORMAL LOW (ref 8.9–10.3)
Chloride: 102 mmol/L (ref 98–111)
Creatinine, Ser: 0.94 mg/dL (ref 0.61–1.24)
GFR calc Af Amer: >60 mL/min (ref >60)
GFR calc non Af Amer: >60 mL/min (ref >60)
Glucose, Bld: 117 mg/dL — ABNORMAL HIGH (ref 70–99)
Potassium: 3.8 mmol/L (ref 3.5–5.1)
Sodium: 134 mmol/L — ABNORMAL LOW (ref 135–145)
Total Bilirubin: 0.6 mg/dL (ref 0.3–1.2)
Total Protein: 8.3 g/dL — ABNORMAL HIGH (ref 6.5–8.1)

## 2018-07-31 LAB — CBC WITH DIFFERENTIAL/PLATELET
Abs Immature Granulocytes: 0.01 10*3/uL (ref 0.00–0.07)
Basophils Absolute: 0 10*3/uL (ref 0.0–0.1)
Basophils Relative: 0 %
Eosinophils Absolute: 0 10*3/uL (ref 0.0–0.5)
Eosinophils Relative: 1 %
HCT: 47.6 % (ref 39.0–52.0)
Hemoglobin: 15.6 g/dL (ref 13.0–17.0)
Immature Granulocytes: 0 %
Lymphocytes Relative: 50 %
Lymphs Abs: 2.3 10*3/uL (ref 0.7–4.0)
MCH: 27.6 pg (ref 26.0–34.0)
MCHC: 32.8 g/dL (ref 30.0–36.0)
MCV: 84.2 fL (ref 80.0–100.0)
Monocytes Absolute: 0.6 10*3/uL (ref 0.1–1.0)
Monocytes Relative: 12 %
Neutro Abs: 1.7 10*3/uL (ref 1.7–7.7)
Neutrophils Relative %: 37 %
Platelets: 214 10*3/uL (ref 150–400)
RBC: 5.65 MIL/uL (ref 4.22–5.81)
RDW: 13.2 % (ref 11.5–15.5)
Smear Review: NORMAL
WBC: 4.6 10*3/uL (ref 4.0–10.5)
nRBC: 0 % (ref 0.0–0.2)

## 2018-07-31 LAB — TROPONIN I: Troponin I: <0.03 ng/mL (ref <0.03)

## 2018-07-31 LAB — FIBRIN DERIVATIVES D-DIMER (ARMC ONLY): Fibrin derivatives D-dimer (ARMC): 362.61 ng/mL (FEU) (ref 0.00–499.00)

## 2018-07-31 LAB — LACTIC ACID, PLASMA: Lactic Acid, Venous: 0.9 mmol/L (ref 0.5–1.9)

## 2018-07-31 MED ORDER — SODIUM CHLORIDE 0.9 % IV BOLUS
1000.0000 mL | Freq: Once | INTRAVENOUS | Status: AC
  Administered 2018-07-31: 1000 mL via INTRAVENOUS

## 2018-07-31 MED ORDER — DOXYCYCLINE HYCLATE 100 MG PO CAPS: 100.0000 mg | ORAL_CAPSULE | Freq: Two times a day (BID) | ORAL | 0 refills | Status: AC

## 2018-07-31 NOTE — ED Provider Notes (Signed)
[facility] Emergency Department Provider Note  ____________________________________________   First MD Initiated Contact with Patient 07/31/18 1459     (approximate)  I have reviewed the triage vital signs and the nursing notes.   HISTORY  Chief Complaint Shortness of Breath and Nausea    HPI Jack Washington is a 53 y.o. male presents emergency department stating he was tested for COVID on Tuesday and got positive results on Thursday.  His entire family has COVID due to his son going to a friend's house where family members from [place] were visiting.  Patient has history of hypertension.  He states that he felt fine until last few days he has had increasing shortness of breath and also had some abdominal pain with diarrhea.   Patient is also concerned as he pulled the tick off of him on Thursday of last week.   Past Medical History:  Diagnosis Date  . Anemia   . Anxiety   . Depression   . History of allergy     Patient Active Problem List   Diagnosis Date Noted  . IBS (irritable bowel syndrome) 05/03/2015  . Generalized abdominal pain 04/14/2015  . Meralgia paresthetica of left side 02/16/2015    Past Surgical History:  Procedure Laterality Date  . APPENDECTOMY  1985  . COLONOSCOPY WITH PROPOFOL N/A 04/03/2015   Procedure: COLONOSCOPY WITH PROPOFOL;  Surgeon: Christena Deem, MD;  Location: [facility] ENDOSCOPY;  Service: Endoscopy;  Laterality: N/A;  . TONSILLECTOMY  1993  . WISDOM TOOTH EXTRACTION  1994    Prior to Admission medications   Medication Sig Start Date End Date Taking? Authorizing Provider  diazepam (VALIUM) 5 MG tablet Take 1 tablet (5 mg total) by mouth every 8 (eight) hours as needed for anxiety. 04/14/15   Anola Gurney, PA  doxycycline (VIBRAMYCIN) 100 MG capsule Take 1 capsule (100 mg total) by mouth 2 (two) times daily. 07/31/18   , Roselyn Bering, PA-C  escitalopram (LEXAPRO) 10 MG tablet Take 1 tablet (10 mg total) by mouth  daily. Start at 1/2 pill for the first week 05/03/15   Anola Gurney, PA  lidocaine (LIDODERM) 5 % Place 1 patch onto the skin every 12 (twelve) hours. Remove & Discard patch within 12 hours or as directed by MD 03/02/18 03/02/19  Joni Reining, PA-C  methocarbamol (ROBAXIN-750) 750 MG tablet Take 1 tablet (750 mg total) by mouth 4 (four) times daily. 03/02/18   Joni Reining, PA-C  metroNIDAZOLE (FLAGYL) 250 MG tablet  04/03/15   [provider]  oxyCODONE-acetaminophen (PERCOCET) 7.5-325 MG tablet Take 1 tablet by mouth every 6 (six) hours as needed for severe pain. 03/02/18   Joni Reining, PA-C  polyethylene glycol powder [facility]) powder  02/23/15   [provider]    Allergies Amitriptyline and Sulfa antibiotics  Family History  Problem Relation Age of Onset  . Panic disorder Mother   . Healthy Daughter   . Healthy Son   . Heart disease Maternal Grandmother        had stent  . Heart failure Maternal Grandmother   . Cancer Paternal Grandfather   . Healthy Son     Social History Social History   Tobacco Use  . Smoking status: Never Smoker  . Smokeless tobacco: Never Used  Substance Use Topics  . Alcohol use: No    Alcohol/week: 0.0 standard drinks  . Drug use: Not on file    Review of Systems  Constitutional:  Positive fever/chills Eyes: No visual changes. ENT: No sore throat. Respiratory: Denies cough, positive shortness of breath Cardiovascular: Positive for some chest pressure Gastrointestinal: Positive for left upper quadrant pain along with some nausea/diarrhea Genitourinary: Negative for dysuria. Musculoskeletal: Negative for back pain. Skin: Negative for rash.    ____________________________________________   PHYSICAL EXAM:  VITAL SIGNS: ED Triage Vitals  Enc Vitals Group     BP 07/31/18 1505 (!) 147/95     Pulse Rate 07/31/18 1505 99     Resp 07/31/18 1505 18     Temp 07/31/18 1505 99.3 F (37.4 C)     Temp src --       SpO2 07/31/18 1505 96 %     Weight 07/31/18 1507 289 lb (131.1 kg)     Height 07/31/18 1507  (1.702 m)     Head Circumference --      Peak Flow --      Pain Score 07/31/18 1507 0     Pain Loc --      Pain Edu? --      Excl. in GC? --     Constitutional: Alert and oriented. Well appearing and in no acute distress. Eyes: Conjunctivae are normal.  Head: Atraumatic. Nose: No congestion/rhinnorhea. Mouth/Throat: Mucous membranes are moist.   Neck:  supple no lymphadenopathy noted Cardiovascular: Normal rate, regular rhythm. Heart sounds are normal Respiratory: Normal respiratory effort.  No retractions, lungs c t a  Abd: soft tender in left upper quadrant Bs normal all 4 quad GU: deferred Musculoskeletal: FROM all extremities, warm and well perfused Neurologic:  Normal speech and language.  Skin:  Skin is warm, dry and intact. No rash noted. Psychiatric: Mood and affect are normal. Speech and behavior are normal.  ____________________________________________   LABS (all labs ordered are listed, but only abnormal results are displayed)  Labs Reviewed  COMPREHENSIVE METABOLIC PANEL - Abnormal; Notable for the following components:      Result Value   Sodium 134 (*)    Glucose, Bld 117 (*)    Calcium 8.6 (*)    Total Protein 8.3 (*)    All other components within normal limits  TROPONIN I  LACTIC ACID, PLASMA  FIBRIN DERIVATIVES D-DIMER ([facility] ONLY)  CBC WITH DIFFERENTIAL/PLATELET  LACTIC ACID, PLASMA   ____________________________________________   ____________________________________________  RADIOLOGY  Chest x-ray  ____________________________________________   PROCEDURES  Procedure(s) performed: , Normal saline 1 L IV   Procedures    ____________________________________________   INITIAL IMPRESSION / ASSESSMENT AND PLAN / ED COURSE  Pertinent labs & imaging results that were available during my care of the patient were reviewed by me and  considered in my medical decision making (see chart for details).   The patient is 53 year old male presents emergency department complaining of increasing and worsening COVID symptoms.  He also had a tick bite on Thursday.  He states he has become a little more short of breath along with some abdominal pain.  He has been in touch with the [facility] department daily and they instructed him to come to the emergency department.  Physical exam patient appears well, blood pressure is a little elevated.  Lungs are clear to auscultation, abdomen is tender in the left upper quadrant.  DDX, worsening COVID-19, gastroenteritis, [place] spotted fever  ----------------------------------------- 4:55 PM on 07/31/2018 -----------------------------------------  CBC, d-dimer, troponin, and lactic acid are normal.  Comprehensive metabolic panel shows a decreased sodium at 134 which is very minimal, increased glucose  117. Chest x-ray showed a patchy infiltrate in the right lung.  Due to the patient's recent tick bite along with patchy infiltrate we added doxycycline.  He is to take Tylenol for fever as needed.  Return emergency department worsening.  We discussed strict instructions for him to return if he becomes more short of breath.  He states he understands will comply.  The patient was discharged in stable condition.     As part of my medical decision making, I reviewed the following data within the electronic MEDICAL RECORD NUMBER Nursing notes reviewed and incorporated, Labs reviewed see above, EKG interpreted NSR, Old chart reviewed, Radiograph reviewed chest x-ray shows patchy infiltrate, Evaluated by EM attending Dr. Mayford KnifeWilliams, Notes from prior ED visits and [place] Controlled Substance Database  ____________________________________________   FINAL CLINICAL IMPRESSION(S) / ED DIAGNOSES  Final diagnoses:  COVID-19 virus infection  Tick bite, initial encounter      NEW MEDICATIONS  STARTED DURING THIS VISIT:  Discharge Medication List as of 07/31/2018  4:17 PM    START taking these medications   Details  doxycycline (VIBRAMYCIN) 100 MG capsule Take 1 capsule (100 mg total) by mouth 2 (two) times daily., Starting Fri 07/31/2018, Print         Note:  This document was prepared using Dragon voice recognition software and may include unintentional dictation errors.    Faythe GheeFisher,  W, PA-C 07/31/18 1657    Emily FilbertWilliams, Jonathan E, MD 08/02/18 [phone]

## 2018-07-31 NOTE — ED Triage Notes (Signed)
Pt to ER states he was tested for Covid on Tuesday and was called on Thursday with positive results.  Pt states fever for 6 days and now with nausea and SHOB.

## 2018-07-31 NOTE — ED Provider Notes (Signed)
Patient seen by me, does not appear to be in any acute distress.  Coronavirus positive on June 2.  We will repeat lab work and x-ray imaging.   Emily Filbert, MD 07/31/18 [phone]

## 2018-07-31 NOTE — Discharge Instructions (Addendum)
Follow-up with your regular doctor if not improving in a few days.  Take your medications as prescribed.  Return to emergency department if worsening.  Your labs are all normal today.  Chest x-ray shows a patchy infiltrate which the doxycycline would clear if it is pneumonia.  If you feel like you are worsening please return the emergency department.

## 2018-08-04 ENCOUNTER — Ambulatory Visit (INDEPENDENT_AMBULATORY_CARE_PROVIDER_SITE_OTHER): Payer: HRSA Program | Admitting: Physician Assistant

## 2018-08-04 ENCOUNTER — Encounter: Payer: Self-pay | Admitting: Physician Assistant

## 2018-08-04 VITALS — Temp 98.9°F | Ht 67.0 in | Wt 280.0 lb

## 2018-08-04 DIAGNOSIS — R9389 Abnormal findings on diagnostic imaging of other specified body structures: Secondary | ICD-10-CM

## 2018-08-04 DIAGNOSIS — J988 Other specified respiratory disorders: Secondary | ICD-10-CM | POA: Diagnosis not present

## 2018-08-04 DIAGNOSIS — U071 COVID-19: Secondary | ICD-10-CM

## 2018-08-04 NOTE — Progress Notes (Signed)
Patient: Jack Washington L Zegers Male    DOB: [birth year]   53 y.o.   MRN: [id number] Visit Date: 08/04/2018  Today's Provider: Trey SailorsAdriana M Simon Aaberg, PA-C   Chief Complaint  Patient presents with  . Follow-up   Subjective:    Virtual Visit via Telephone Note  I connected with Jack Washington L Bianchini on 08/04/18 at 10:00 AM EDT by telephone and verified that I am speaking with the correct person using two identifiers.   I discussed the limitations, risks, security and privacy concerns of performing an evaluation and management service by telephone and the availability of in person appointments. I also discussed with the patient that there may be a patient responsible charge related to this service. The patient expressed understanding and agreed to proceed.  Patient location: home Provider location[facility]  Persons involved in the visit: patient, provider   HPI   Follow up for COVID-19  The patient was last seen for this 6 days ago. Changes made at last visit include patient states he has been taking Tylenol for fever.  He reports excellent compliance with treatment. He feels that condition is improved. He is not having side effects.   He was tested for COVID-19 on 07/28/2018 and was positive after experiencing about 10 days of symptoms. Reports his whole family was positive. He presented to the ER on 07/31/2018 with difficulty breathing and had a patchy infiltrate on CXR. He was treated with three weeks of doxycycline 100 mg BID because he also had a history of a tick bite. Reports fever broke over the weekend, was feeling better. Then yesterday reports his fever was approaching 100F and has some nausea. He reports he is feeling better today. Reports he does not have shortness of breath or difficulty or coughing. Denies muscle aches, has some muscle stiffness. Reports he got illness from his son who stayed with visiting family members at a friend's house.   Reports the  health department has recommended he be out of work until 08/19/2018. He works for the [employer] whom he reports has been understanding of the issue.   He reports the tick bite in question happened when he was mowing the lawn the prior week. He reports he takes a shower every day and feels he pulled the tick off within minutes to hours of the bite.  ------------------------------------------------------------------------------------    Allergies  Allergen Reactions  . Amitriptyline Other (See Comments)    Suicidal ideation  . Sulfa Antibiotics      Current Outpatient Medications:  .  doxycycline (VIBRAMYCIN) 100 MG capsule, Take 1 capsule (100 mg total) by mouth 2 (two) times daily., Disp: 42 capsule, Rfl: 0  Review of Systems  Constitutional: Negative.   Respiratory: Negative.     Social History   Tobacco Use  . Smoking status: Never Smoker  . Smokeless tobacco: Never Used  Substance Use Topics  . Alcohol use: No    Alcohol/week: 0.0 standard drinks      Objective:   Temp 98.9 F (37.2 C) (Axillary)   Ht 5\' 7"  (1.702 m)   Wt 280 lb (127 kg)   SpO2 97%   BMI 43.85 kg/m  Vitals:   08/04/18 0917  Temp: 98.9 F (37.2 C)  TempSrc: Axillary  SpO2: 97%  Weight: 280 lb (127 kg)  Height: 5\' 7"  (1.702 m)     Physical Exam Pulmonary:     Comments: Talking in complete sentences during phone conversation.  Assessment & Plan    1. COVID-19  Patient recovering from [place] and seems to be doing well. Counseled on symptomatic treatments and return precautions advised. Counseled that he may stop doxycycline at 10 days as tick bite he described would be very unlikely to transmit disease. I will order CXR for him to get on or around 08/30/2018 to reassess lungs. Work note sent through [facility]; Future  2. Infiltrate noted on imaging study  F/u PRN     Trinna Post, PA-C  [facility]

## 2018-08-04 NOTE — Patient Instructions (Signed)
This information is directly available on the CDC website: https://www.cdc.gov/coronavirus/2019-ncov/if-you-are-sick/steps-when-sick.html    Source:CDC Reference to specific commercial products, manufacturers, companies, or trademarks does not constitute its endorsement or recommendation by the U.S. Government, Department of Health and Human Services, or Centers for Disease Control and Prevention.  

## 2018-08-07 ENCOUNTER — Telehealth: Payer: Self-pay | Admitting: *Deleted

## 2018-08-07 NOTE — Telephone Encounter (Signed)
Patient was advised.  

## 2018-08-07 NOTE — Telephone Encounter (Signed)
Patient's wife called office stating the doxycycline that pt was prescribed has been making him sick. Patient has been nauseous while taking antibiotic and would like a different medication sent to pharmacy. Please advise?

## 2018-08-07 NOTE — Telephone Encounter (Signed)
It looks like he has had 7 days of the antibiotic. That is enough time to treat his issues and he should not need another antibiotic. He can simply stop the doxycycline.

## 2018-08-17 ENCOUNTER — Telehealth: Payer: Self-pay | Admitting: Physician Assistant

## 2018-08-17 NOTE — Telephone Encounter (Signed)
Can we schedule him a virtual video visit. He will need to be re-evaluated, may need repeat CXR to see that things are improving.

## 2018-08-17 NOTE — Telephone Encounter (Signed)
Patient advised as below.  

## 2018-08-17 NOTE — Telephone Encounter (Signed)
Patient's wife called, Tammy.  Per her, Allie is not any better but not any worse.  He is "stuck" in the middle.   She said he is being treated for Corona virus, tick bite and pneumonia.    She wants to discuss this with Adriana please.

## 2018-08-18 ENCOUNTER — Telehealth (INDEPENDENT_AMBULATORY_CARE_PROVIDER_SITE_OTHER): Payer: HRSA Program | Admitting: Physician Assistant

## 2018-08-18 DIAGNOSIS — W57XXXS Bitten or stung by nonvenomous insect and other nonvenomous arthropods, sequela: Secondary | ICD-10-CM

## 2018-08-18 DIAGNOSIS — U071 COVID-19: Secondary | ICD-10-CM

## 2018-08-18 DIAGNOSIS — R918 Other nonspecific abnormal finding of lung field: Secondary | ICD-10-CM

## 2018-08-18 NOTE — Progress Notes (Signed)
Subjective:    Patient ID: Jack Washington, male    DOB: 1965/08/11, 53 y.o.   MRN: [id number]  Jack Washington is a 53 y.o. male presenting on 08/18/2018 for Cough  Virtual Visit via Telephone Note  I connected with Jack Washington on 08/20/18 at  2:00 PM EDT by telephone and verified that I am speaking with the correct person using two identifiers.   I discussed the limitations, risks, security and privacy concerns of performing an evaluation and management service by telephone and the availability of in person appointments. I also discussed with the patient that there may be a patient responsible charge related to this service. The patient expressed understanding and agreed to proceed.  Patient location: home [facility]  Persons involved in the visit: patient, provider    HPI  Was diagnosed with COVID on 07/29/2018. He presented to the ER on 07/31/2018 and CXR showed questionable mild patchy infiltrate in lateral mid right lung. He was treated with 7 days of doxycycline which was then discontinued due to GI side effects. Nausea resolved after stopping doxycycline. He denies fevers, chills, nausea, vomiting, cough. Feels very run down and fatigued.   Social History   Tobacco Use  . Smoking status: Never Smoker  . Smokeless tobacco: Never Used  Substance Use Topics  . Alcohol use: No    Alcohol/week: 0.0 standard drinks  . Drug use: Not on file    Review of Systems Per HPI unless specifically indicated above     Objective:    There were no vitals taken for this visit.  Wt Readings from Last 3 Encounters:  08/04/18 280 lb (127 kg)  07/31/18 289 lb (131.1 kg)  03/02/18 270 lb (122.5 kg)    Physical Exam Pulmonary:     Effort: No respiratory distress.     Comments: Talking in complete sentences.    Results for orders placed or performed during the hospital encounter of 07/31/18  Comprehensive metabolic panel  Result Value Ref  Range   Sodium 134 (L) 135 - 145 mmol/L   Potassium 3.8 3.5 - 5.1 mmol/L   Chloride 102 98 - 111 mmol/L   CO2 23 22 - 32 mmol/L   Glucose, Bld 117 (H) 70 - 99 mg/dL   BUN 13 6 - 20 mg/dL   Creatinine, Ser 4.090.94 0.61 - 1.24 mg/dL   Calcium 8.6 (L) 8.9 - 10.3 mg/dL   Total Protein 8.3 (H) 6.5 - 8.1 g/dL   Albumin 3.9 3.5 - 5.0 g/dL   AST 23 15 - 41 U/L   ALT 26 0 - 44 U/L   Alkaline Phosphatase 39 38 - 126 U/L   Total Bilirubin 0.6 0.3 - 1.2 mg/dL   GFR calc non Af Amer >60 >60 mL/min   GFR calc Af Amer >60 >60 mL/min   Anion gap 9 5 - 15  Troponin I - Once  Result Value Ref Range   Troponin I <0.03 <0.03 ng/mL  Lactic acid, plasma  Result Value Ref Range   Lactic Acid, Venous 0.9 0.5 - 1.9 mmol/L  Fibrin derivatives D-Dimer  Result Value Ref Range   Fibrin derivatives D-dimer ([facility]) 362.61 0.00 - 499.00 ng/mL (FEU)  CBC with Differential  Result Value Ref Range   WBC 4.6 4.0 - 10.5 K/uL   RBC 5.65 4.22 - 5.81 MIL/uL   Hemoglobin 15.6 13.0 - 17.0 g/dL   HCT 81.147.6 91.439.0 - 78.252.0 %   MCV  84.2 80.0 - 100.0 fL   MCH 27.6 26.0 - 34.0 pg   MCHC 32.8 30.0 - 36.0 g/dL   RDW 13.2 11.5 - 15.5 %   Platelets 214 150 - 400 K/uL   nRBC 0.0 0.0 - 0.2 %   Neutrophils Relative % 37 %   Neutro Abs 1.7 1.7 - 7.7 K/uL   Lymphocytes Relative 50 %   Lymphs Abs 2.3 0.7 - 4.0 K/uL   Monocytes Relative 12 %   Monocytes Absolute 0.6 0.1 - 1.0 K/uL   Eosinophils Relative 1 %   Eosinophils Absolute 0.0 0.0 - 0.5 K/uL   Basophils Relative 0 %   Basophils Absolute 0.0 0.0 - 0.1 K/uL   WBC Morphology MORPHOLOGY UNREMARKABLE    RBC Morphology MORPHOLOGY UNREMARKABLE    Smear Review Normal platelet morphology    Immature Granulocytes 0 %   Abs Immature Granulocytes 0.01 0.00 - 0.07 K/uL      Assessment & Plan:  1. COVID-19 virus infection  Will assess for worsening pneumonia as below. [place] and he states he will go tomorrow.  CXR is normal with no  infiltrate and bloodwork is normal as well. I have personally reviewed the image and agree with the findings. Suspect his fatigue is related to recovering from COVID and not worsening disease.   - DG Chest 2 View; Future - CBC with Differential - Comprehensive Metabolic Panel (CMET)  2. Right middle lobe pulmonary infiltrate  Resolved.  - DG Chest 2 View; Future  3. Tick bite, sequela  Pending.  - Lyme Ab/Western Blot Reflex - Rocky mtn spotted fvr abs pnl(IgG+IgM)  The entirety of the information documented in the History of Present Illness, Review of Systems and Physical Exam were personally obtained by me. Portions of this information were initially documented by Lynford Humphrey, CMA and reviewed by me for thoroughness and accuracy.    Follow up plan: Return if symptoms worsen or fail to improve.  [facility] 08/20/2018, 1:10 PM

## 2018-08-19 ENCOUNTER — Other Ambulatory Visit
Admission: RE | Admit: 2018-08-19 | Discharge: 2018-08-19 | Disposition: A | Discharge status: Home / Self Care | Payer: HRSA Program | Source: Ambulatory Visit | Attending: Physician Assistant | Admitting: Physician Assistant

## 2018-08-19 ENCOUNTER — Ambulatory Visit
Admission: RE | Admit: 2018-08-19 | Discharge: 2018-08-19 | Disposition: A | Discharge status: Home / Self Care | Payer: HRSA Program | Source: Ambulatory Visit | Attending: Physician Assistant | Admitting: Physician Assistant

## 2018-08-19 DIAGNOSIS — R918 Other nonspecific abnormal finding of lung field: Secondary | ICD-10-CM | POA: Insufficient documentation

## 2018-08-19 DIAGNOSIS — U071 COVID-19: Secondary | ICD-10-CM

## 2018-08-19 LAB — CBC WITH DIFFERENTIAL/PLATELET
Abs Immature Granulocytes: 0.03 10*3/uL (ref 0.00–0.07)
Basophils Absolute: 0 10*3/uL (ref 0.0–0.1)
Basophils Relative: 0 %
Eosinophils Absolute: 0.1 10*3/uL (ref 0.0–0.5)
Eosinophils Relative: 2 %
HCT: 42 % (ref 39.0–52.0)
Hemoglobin: 13.6 g/dL (ref 13.0–17.0)
Immature Granulocytes: 0 %
Lymphocytes Relative: 38 %
Lymphs Abs: 3.5 10*3/uL (ref 0.7–4.0)
MCH: 28 pg (ref 26.0–34.0)
MCHC: 32.4 g/dL (ref 30.0–36.0)
MCV: 86.4 fL (ref 80.0–100.0)
Monocytes Absolute: 0.9 10*3/uL (ref 0.1–1.0)
Monocytes Relative: 10 %
Neutro Abs: 4.5 10*3/uL (ref 1.7–7.7)
Neutrophils Relative %: 50 %
Platelets: 250 10*3/uL (ref 150–400)
RBC: 4.86 MIL/uL (ref 4.22–5.81)
RDW: 13.7 % (ref 11.5–15.5)
WBC: 9 10*3/uL (ref 4.0–10.5)
nRBC: 0 % (ref 0.0–0.2)

## 2018-08-19 LAB — COMPREHENSIVE METABOLIC PANEL
ALT: 23 U/L (ref 0–44)
AST: 17 U/L (ref 15–41)
Albumin: 3.8 g/dL (ref 3.5–5.0)
Alkaline Phosphatase: 40 U/L (ref 38–126)
Anion gap: 9 (ref 5–15)
BUN: 15 mg/dL (ref 6–20)
CO2: 26 mmol/L (ref 22–32)
Calcium: 9 mg/dL (ref 8.9–10.3)
Chloride: 102 mmol/L (ref 98–111)
Creatinine, Ser: 0.86 mg/dL (ref 0.61–1.24)
GFR calc Af Amer: >60 mL/min (ref >60)
GFR calc non Af Amer: >60 mL/min (ref >60)
Glucose, Bld: 113 mg/dL — ABNORMAL HIGH (ref 70–99)
Potassium: 3.9 mmol/L (ref 3.5–5.1)
Sodium: 137 mmol/L (ref 135–145)
Total Bilirubin: 1.1 mg/dL (ref 0.3–1.2)
Total Protein: 7.9 g/dL (ref 6.5–8.1)

## 2018-08-20 ENCOUNTER — Telehealth: Payer: Self-pay

## 2018-08-20 ENCOUNTER — Encounter: Payer: Self-pay | Admitting: Physician Assistant

## 2018-08-20 ENCOUNTER — Telehealth: Payer: Self-pay | Admitting: Physician Assistant

## 2018-08-20 NOTE — Patient Instructions (Signed)
This information is directly available on the CDC website: https://www.cdc.gov/coronavirus/2019-ncov/if-you-are-sick/steps-when-sick.html    Source:CDC Reference to specific commercial products, manufacturers, companies, or trademarks does not constitute its endorsement or recommendation by the U.S. Government, Department of Health and Human Services, or Centers for Disease Control and Prevention.  

## 2018-08-20 NOTE — Telephone Encounter (Signed)
Work note sent.  

## 2018-08-20 NOTE — Telephone Encounter (Signed)
-----   Message from Trinna Post, [place] sent at 08/20/2018  8:19 AM EDT ----- His CXR is normal. It does not show pneumonia. I think his feeling tired is related to having COVID and not a new or progressive pneumonia.

## 2018-08-20 NOTE — Telephone Encounter (Signed)
Patient advised as below. Patient requesting a note to return to work. sd

## 2018-08-20 NOTE — Telephone Encounter (Signed)
-----   Message from Trinna Post, [place] sent at 08/19/2018  4:41 PM EDT ----- Blood work looks good, no white count. Waiting on CXR.

## 2018-08-21 LAB — ROCKY MTN SPOTTED FVR ABS PNL(IGG+IGM)
RMSF IgG: NEGATIVE
RMSF IgM: 0.14 index (ref 0.00–0.89)

## 2018-08-26 ENCOUNTER — Telehealth: Payer: Self-pay

## 2018-08-26 LAB — LYME DISEASE, WESTERN BLOT
IgG P23 Ab.: ABSENT
IgG P28 Ab.: ABSENT
IgG P30 Ab.: ABSENT
IgG P39 Ab.: ABSENT
IgG P45 Ab.: ABSENT
IgG P58 Ab.: ABSENT
IgG P66 Ab.: ABSENT
IgG P93 Ab.: ABSENT
IgM P23 Ab.: ABSENT
IgM P39 Ab.: ABSENT
IgM P41 Ab.: ABSENT
Lyme IgG Wb: NEGATIVE
Lyme IgM Wb: NEGATIVE

## 2018-08-26 NOTE — Telephone Encounter (Signed)
-----   Message from Trinna Post, [place] sent at 08/26/2018  8:15 AM EDT ----- Lyme testing negative.

## 2018-08-26 NOTE — Telephone Encounter (Signed)
Patient advised.KW 

## 2018-08-26 NOTE — Telephone Encounter (Signed)
lmtcb

## 2018-08-26 NOTE — Telephone Encounter (Signed)
-----   Message from Trinna Post, [place] sent at 08/25/2018  8:42 AM EDT ----- Tick testing negative.

## 2018-08-27 ENCOUNTER — Encounter: Payer: Self-pay | Admitting: Physician Assistant

## 2019-11-16 ENCOUNTER — Emergency Department: Payer: Self-pay

## 2019-11-16 ENCOUNTER — Other Ambulatory Visit: Payer: Self-pay

## 2019-11-16 ENCOUNTER — Emergency Department
Admission: EM | Admit: 2019-11-16 | Discharge: 2019-11-16 | Disposition: A | Discharge status: Home / Self Care | Payer: Self-pay | Attending: Student in an Organized Health Care Education/Training Program | Admitting: Student in an Organized Health Care Education/Training Program

## 2019-11-16 DIAGNOSIS — R0600 Dyspnea, unspecified: Secondary | ICD-10-CM | POA: Insufficient documentation

## 2019-11-16 DIAGNOSIS — I10 Essential (primary) hypertension: Secondary | ICD-10-CM | POA: Insufficient documentation

## 2019-11-16 LAB — CBC WITH DIFFERENTIAL/PLATELET
Abs Immature Granulocytes: 0.03 10*3/uL (ref 0.00–0.07)
Basophils Absolute: 0 10*3/uL (ref 0.0–0.1)
Basophils Relative: 1 %
Eosinophils Absolute: 0.2 10*3/uL (ref 0.0–0.5)
Eosinophils Relative: 3 %
HCT: 43.4 % (ref 39.0–52.0)
Hemoglobin: 14.3 g/dL (ref 13.0–17.0)
Immature Granulocytes: 0 %
Lymphocytes Relative: 44 %
Lymphs Abs: 3.6 10*3/uL (ref 0.7–4.0)
MCH: 28.5 pg (ref 26.0–34.0)
MCHC: 32.9 g/dL (ref 30.0–36.0)
MCV: 86.6 fL (ref 80.0–100.0)
Monocytes Absolute: 0.6 10*3/uL (ref 0.1–1.0)
Monocytes Relative: 7 %
Neutro Abs: 3.7 10*3/uL (ref 1.7–7.7)
Neutrophils Relative %: 45 %
Platelets: 278 10*3/uL (ref 150–400)
RBC: 5.01 MIL/uL (ref 4.22–5.81)
RDW: 13.6 % (ref 11.5–15.5)
WBC: 8.2 10*3/uL (ref 4.0–10.5)
nRBC: 0 % (ref 0.0–0.2)

## 2019-11-16 LAB — COMPREHENSIVE METABOLIC PANEL
ALT: 20 U/L (ref 0–44)
AST: 17 U/L (ref 15–41)
Albumin: 4.2 g/dL (ref 3.5–5.0)
Alkaline Phosphatase: 43 U/L (ref 38–126)
Anion gap: 8 (ref 5–15)
BUN: 12 mg/dL (ref 6–20)
CO2: 26 mmol/L (ref 22–32)
Calcium: 8.7 mg/dL — ABNORMAL LOW (ref 8.9–10.3)
Chloride: 101 mmol/L (ref 98–111)
Creatinine, Ser: 0.89 mg/dL (ref 0.61–1.24)
GFR calc Af Amer: >60 mL/min (ref >60)
GFR calc non Af Amer: >60 mL/min (ref >60)
Glucose, Bld: 109 mg/dL — ABNORMAL HIGH (ref 70–99)
Potassium: 3.8 mmol/L (ref 3.5–5.1)
Sodium: 135 mmol/L (ref 135–145)
Total Bilirubin: 0.7 mg/dL (ref 0.3–1.2)
Total Protein: 8.2 g/dL — ABNORMAL HIGH (ref 6.5–8.1)

## 2019-11-16 LAB — FIBRIN DERIVATIVES D-DIMER (ARMC ONLY): Fibrin derivatives D-dimer (ARMC): 285.32 ng/mL (FEU) (ref 0.00–499.00)

## 2019-11-16 LAB — TROPONIN I (HIGH SENSITIVITY): Troponin I (High Sensitivity): 8 ng/L (ref <18)

## 2019-11-16 MED ORDER — AMLODIPINE BESYLATE 5 MG PO TABS: 5.0000 mg | ORAL_TABLET | Freq: Every day | ORAL | 1 refills | Status: DC

## 2019-11-16 MED ORDER — AMLODIPINE BESYLATE 5 MG PO TABS: 5.0000 mg | ORAL_TABLET | Freq: Once | ORAL | Status: DC

## 2019-11-16 NOTE — ED Notes (Signed)
Patient verbalizes understanding of discharge instructions. Opportunity for questioning and answers were provided. Armband removed by staff, pt discharged from ED. Rx's given, and pt ambulated out to lobby

## 2019-11-16 NOTE — ED Provider Notes (Signed)
[facility] Emergency Department Provider Note    First MD Initiated Contact with Patient 11/16/19 [phone]     (approximate)  I have reviewed the triage vital signs and the nursing notes.   HISTORY  Chief Complaint Shortness of Breath    HPI Jack Washington is a 54 y.o. male with the below listed past medical history presents to the ER for evaluation of shortness of breath that awoke him from sleep melanite.  He is a Marine scientist.  No history of PE.  Was diagnosed with Covid last year.  No anticoagulation.  Denies any chest pain or pressure right now.  States that the symptom was like he was wearing a mask and could not breathe then.  Denies any wheezing.  Denies any pain at this time.  Does not smoke.    Past Medical History:  Diagnosis Date  . Anemia   . Anxiety   . Depression   . History of allergy    Family History  Problem Relation Age of Onset  . Panic disorder Mother   . Healthy Daughter   . Healthy Son   . Heart disease Maternal Grandmother        had stent  . Heart failure Maternal Grandmother   . Cancer Paternal Grandfather   . Healthy Son    Past Surgical History:  Procedure Laterality Date  . APPENDECTOMY  1985  . COLONOSCOPY WITH PROPOFOL N/A 04/03/2015   Procedure: COLONOSCOPY WITH PROPOFOL;  Surgeon: Christena Deem, MD;  Location: [facility] ENDOSCOPY;  Service: Endoscopy;  Laterality: N/A;  . TONSILLECTOMY  1993  . WISDOM TOOTH EXTRACTION  1994   Patient Active Problem List   Diagnosis Date Noted  . IBS (irritable bowel syndrome) 05/03/2015  . Generalized abdominal pain 04/14/2015  . Meralgia paresthetica of left side 02/16/2015      Prior to Admission medications   Medication Sig Start Date End Date Taking? Authorizing Provider  amLODipine (NORVASC) 5 MG tablet Take 1 tablet (5 mg total) by mouth daily for 14 days. 11/16/19 11/30/19  Willy Eddy, MD  doxycycline (VIBRAMYCIN) 100 MG capsule Take 1 capsule (100 mg  total) by mouth 2 (two) times daily. 07/31/18   Faythe Ghee, PA-C    Allergies Amitriptyline and Sulfa antibiotics    Social History Social History   Tobacco Use  . Smoking status: Never Smoker  . Smokeless tobacco: Never Used  Substance Use Topics  . Alcohol use: No    Alcohol/week: 0.0 standard drinks  . Drug use: Never    Review of Systems Patient denies headaches, rhinorrhea, blurry vision, numbness, shortness of breath, chest pain, edema, cough, abdominal pain, nausea, vomiting, diarrhea, dysuria, fevers, rashes or hallucinations unless otherwise stated above in HPI. ____________________________________________   PHYSICAL EXAM:  VITAL SIGNS: Vitals:   11/16/19 0900 11/16/19 0930  BP: (!) 175/120 (!) 176/122  Pulse: 76 69  Resp:    Temp:    SpO2: 98% 97%    Constitutional: Alert and oriented.  Eyes: Conjunctivae are normal.  Head: Atraumatic. Nose: No congestion/rhinnorhea. Mouth/Throat: Mucous membranes are moist.   Neck: No stridor. Painless ROM.  Cardiovascular: Normal rate, regular rhythm. Grossly normal heart sounds.  Good peripheral circulation. Respiratory: Normal respiratory effort.  No retractions. Lungs CTAB. Gastrointestinal: Soft and nontender. No distention. No abdominal bruits. No CVA tenderness. Genitourinary:  Musculoskeletal: No lower extremity tenderness nor edema.  No joint effusions. Neurologic:  Normal speech and language. No gross focal neurologic  deficits are appreciated. No facial droop Skin:  Skin is warm, dry and intact. No rash noted. Psychiatric: Mood and affect are normal. Speech and behavior are normal.  ____________________________________________   LABS (all labs ordered are listed, but only abnormal results are displayed)  Results for orders placed or performed during the hospital encounter of 11/16/19 (from the past 24 hour(s))  CBC with Differential/Platelet     Status: None   Collection Time: 11/16/19  8:29 AM    Result Value Ref Range   WBC 8.2 4.0 - 10.5 K/uL   RBC 5.01 4.22 - 5.81 MIL/uL   Hemoglobin 14.3 13.0 - 17.0 g/dL   HCT 47.0 39 - 52 %   MCV 86.6 80.0 - 100.0 fL   MCH 28.5 26.0 - 34.0 pg   MCHC 32.9 30.0 - 36.0 g/dL   RDW 96.2 83.6 - 62.9 %   Platelets 278 150 - 400 K/uL   nRBC 0.0 0.0 - 0.2 %   Neutrophils Relative % 45 %   Neutro Abs 3.7 1.7 - 7.7 K/uL   Lymphocytes Relative 44 %   Lymphs Abs 3.6 0.7 - 4.0 K/uL   Monocytes Relative 7 %   Monocytes Absolute 0.6 0 - 1 K/uL   Eosinophils Relative 3 %   Eosinophils Absolute 0.2 0 - 0 K/uL   Basophils Relative 1 %   Basophils Absolute 0.0 0 - 0 K/uL   Immature Granulocytes 0 %   Abs Immature Granulocytes 0.03 0.00 - 0.07 K/uL  Comprehensive metabolic panel     Status: Abnormal   Collection Time: 11/16/19  8:29 AM  Result Value Ref Range   Sodium 135 135 - 145 mmol/L   Potassium 3.8 3.5 - 5.1 mmol/L   Chloride 101 98 - 111 mmol/L   CO2 26 22 - 32 mmol/L   Glucose, Bld 109 (H) 70 - 99 mg/dL   BUN 12 6 - 20 mg/dL   Creatinine, Ser 4.76 0.61 - 1.24 mg/dL   Calcium 8.7 (L) 8.9 - 10.3 mg/dL   Total Protein 8.2 (H) 6.5 - 8.1 g/dL   Albumin 4.2 3.5 - 5.0 g/dL   AST 17 15 - 41 U/L   ALT 20 0 - 44 U/L   Alkaline Phosphatase 43 38 - 126 U/L   Total Bilirubin 0.7 0.3 - 1.2 mg/dL   GFR calc non Af Amer >60 >60 mL/min   GFR calc Af Amer >60 >60 mL/min   Anion gap 8 5 - 15  Fibrin derivatives D-Dimer ([facility] only)     Status: None   Collection Time: 11/16/19  8:29 AM  Result Value Ref Range   Fibrin derivatives D-dimer ([facility]) 285.32 0.00 - 499.00 ng/mL (FEU)  Troponin I (High Sensitivity)     Status: None   Collection Time: 11/16/19  8:29 AM  Result Value Ref Range   Troponin I (High Sensitivity) 8 <18 ng/L   ____________________________________________  EKG My review and personal interpretation at Time: 3:00  Indication: sob  Rate: 80  Rhythm: sinus Axis: nml Other: motion artifact, no  stemi ____________________________________________  RADIOLOGY  I personally reviewed all radiographic images ordered to evaluate for the above acute complaints and reviewed radiology reports and findings.  These findings were personally discussed with the patient.  Please see medical record for radiology report. ____________________________________________   PROCEDURES  Procedure(s) performed:  Procedures    Critical Care performed: no ____________________________________________   INITIAL IMPRESSION / ASSESSMENT AND PLAN / ED COURSE  Pertinent  labs & imaging results that were available during my care of the patient were reviewed by me and considered in my medical decision making (see chart for details).   DDX: Asthma, copd, CHF, pna, ptx, malignancy, Pe, anemia   BRENTT FREAD is a 54 y.o. who presents to the ED with episode of shortness of breath as described above.  He currently feels well and in no acute distress.  Given his age and risk factors will order blood work.  His EKG is nonischemic.  Does not seem consistent with ACS.  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  I do not appreciate any wheezing on exam.  Patient admits to poor diet and several pound weight gain over the past year largely related to his work and is also feeling quite stressed at home as his mother just had surgery yesterday.  Clinical Course as of Nov 15 1024  Tue Nov 16, 2019  1023 Patient reassessed.  Nontoxic appearing he is pain-free.  Is hypertensive and not currently on any antihypertensive medications.  Is not showing any signs or symptoms of acute heart failure but will start on antihypertensive headache medication.  Patient states he would like to talk to his PCP in follow-up to determine long-term antihypertensive medication.  Admits that his diet is likely contributing to a large component of this.  Does appear clinically appropriate for outpatient follow-up.    [PR]    Clinical Course User Index [PR] Willy Eddy, MD    The patient was evaluated in Emergency Department today for the symptoms described in the history of present illness. He/she was evaluated in the context of the global COVID-19 pandemic, which necessitated consideration that the patient might be at risk for infection with the SARS-CoV-2 virus that causes COVID-19. Institutional protocols and algorithms that pertain to the evaluation of patients at risk for COVID-19 are in a state of rapid change based on information released by regulatory bodies including the CDC and federal and state organizations. These policies and algorithms were followed during the patient's care in the ED.  As part of my medical decision making, I reviewed the following data within the electronic MEDICAL RECORD NUMBER Nursing notes reviewed and incorporated, Labs reviewed, notes from prior ED visits and [place] Controlled Substance Database   ____________________________________________   FINAL CLINICAL IMPRESSION(S) / ED DIAGNOSES  Final diagnoses:  Dyspnea, unspecified type  Hypertension, unspecified type      NEW MEDICATIONS STARTED DURING THIS VISIT:  New Prescriptions   AMLODIPINE (NORVASC) 5 MG TABLET    Take 1 tablet (5 mg total) by mouth daily for 14 days.     Note:  This document was prepared using Dragon voice recognition software and may include unintentional dictation errors.    Willy Eddy, MD 11/16/19 1026

## 2019-11-16 NOTE — ED Triage Notes (Signed)
PT to ED c/o shob. PT ambulatory with no distress, able to speak in full and complete sentences. PT states it just feels like he has his mask on all the time. No other sx like cough or CP

## 2019-11-22 ENCOUNTER — Telehealth: Payer: Self-pay

## 2019-11-22 NOTE — Telephone Encounter (Signed)
We can address his work note at his visit, he would have to be seen.

## 2019-11-22 NOTE — Telephone Encounter (Signed)
Copied from CRM [id number]. Topic: General - Other >> Nov 19, 2019  4:05 PM Jaquita Rector A wrote: Reason for CRM: Patient called and scheduled an appointment with Osvaldo Angst he is asking if she can give him a note for being out of work for the past 2 days he have been out if work due to issues with his BP. Patient also would like an office visit which was not allowed since he had SOB and headache please advise Ph# [phone]

## 2019-11-22 NOTE — Progress Notes (Deleted)
     Established patient visit   Patient: Jack Washington   DOB: 1965/11/15   54 y.o. Male  MRN: [id number] Visit Date: 11/23/2019  Today's healthcare provider: Trey Sailors, PA-C   No chief complaint on file.  Subjective    HPI  Hypertension, follow-up  BP Readings from Last 3 Encounters:  11/16/19 (!) 176/122  07/31/18 (!) 132/105  03/02/18 (!) 161/98   Wt Readings from Last 3 Encounters:  11/16/19 300 lb (136.1 kg)  08/04/18 280 lb (127 kg)  07/31/18 289 lb (131.1 kg)     He was last seen for hypertension {NUMBERS 1-12:18279} {days/wks/mos/yrs:310907} ago.  BP at that visit was ***. Management since that visit includes ***.  He reports {excellent/good/fair/poor:19665} compliance with treatment. He {is/is not:9024} having side effects. {document side effects if present:1} He is following a {diet:21022986} diet. He {is/is not:9024} exercising. He {does/does not:200015} smoke.  Use of agents associated with hypertension: {bp agents assoc with hypertension:511::"none"}.   Outside blood pressures are {***enter patient reported home BP readings, or 'not being checked':1}. Symptoms: {Yes/No:20286} chest pain {Yes/No:20286} chest pressure  {Yes/No:20286} palpitations {Yes/No:20286} syncope  {Yes/No:20286} dyspnea {Yes/No:20286} orthopnea  {Yes/No:20286} paroxysmal nocturnal dyspnea {Yes/No:20286} lower extremity edema   Pertinent labs: No results found for: CHOL, HDL, LDLCALC, LDLDIRECT, TRIG, CHOLHDL Lab Results  Component Value Date   NA 135 11/16/2019   K 3.8 11/16/2019   CREATININE 0.89 11/16/2019   GFRNONAA >60 11/16/2019   GFRAA >60 11/16/2019   GLUCOSE 109 (H) 11/16/2019     The ASCVD Risk score Denman George DC Jr., et al., 2013) failed to calculate for the following reasons:   Cannot find a previous HDL lab   Cannot find a previous total cholesterol lab    ---------------------------------------------------------------------------------------------------   {Show patient history (optional):23778::" "}   Medications: Outpatient Medications Prior to Visit  Medication Sig  . amLODipine (NORVASC) 5 MG tablet Take 1 tablet (5 mg total) by mouth daily for 14 days.  Marland Kitchen doxycycline (VIBRAMYCIN) 100 MG capsule Take 1 capsule (100 mg total) by mouth 2 (two) times daily.   No facility-administered medications prior to visit.    Review of Systems  {Heme  Chem  Endocrine  Serology  Results Review (optional):23779::" "}  Objective    There were no vitals taken for this visit. {Show previous vital signs (optional):23777::" "}  Physical Exam  ***  No results found for any visits on 11/23/19.  Assessment & Plan     ***  No follow-ups on file.      {provider attestation***:1}   Maryella Shivers  [facility] [phone] (phone) [phone] (fax)  [facility]

## 2019-11-23 ENCOUNTER — Ambulatory Visit: Payer: Self-pay | Admitting: Physician Assistant

## 2019-11-24 NOTE — Progress Notes (Signed)
Established patient visit   Patient: Jack Washington   DOB: 07/03/1965   54 y.o. Male  MRN: [id number] Visit Date: 11/25/2019  Today's healthcare provider: Trey Sailors, PA-C   Chief Complaint  Patient presents with  . Hypertension  I,Porsha C McClurkin,acting as a scribe for [employer], PA-C.,have documented all relevant documentation on the behalf of Trey Sailors, PA-C,as directed by  Trey Sailors, PA-C while in the presence of Trey Sailors, PA-C.  Subjective    HPI  Follow up Hospitalization  Patient was admitted to [facility] on 11/16/2019 and discharged on 11/17/19. He was treated for dyspnea and hypertension. Treatment for this included EKG, D-Dimer, chest x-ray and labs. Telephone follow up was done on none He reports satisfactory compliance with treatment. He reports this condition is stayed the same. Patient reports blood pressure readings are around 150's/100's at home.   He was started on amlodipine 5 mg daily. He reports he missed this medication today. Reports he works for [employer] and is driving long distances. Reports his diet is very poor and he frequently drinks sugary drinks including mountain dew, gatorade, ginger ale. He will often snack on processed foods.  ----------------------------------------------------------------------------------------- -        Medications: Outpatient Medications Prior to Visit  Medication Sig  . amLODipine (NORVASC) 5 MG tablet Take 1 tablet (5 mg total) by mouth daily for 14 days.  Marland Kitchen doxycycline (VIBRAMYCIN) 100 MG capsule Take 1 capsule (100 mg total) by mouth 2 (two) times daily. (Patient not taking: Reported on 11/25/2019)   No facility-administered medications prior to visit.    Review of Systems  Constitutional: Negative.   Respiratory: Negative.   Cardiovascular: Negative.   Hematological: Negative.       Objective    BP (!) 164/104 (BP Location: Left Arm, Patient Position: Sitting, Cuff Size:  Large)   Pulse 100   Temp 98.3 F (36.8 C) (Oral)   Wt (!) 307 lb 6.4 oz (139.4 kg)   SpO2 100%   BMI 46.74 kg/m    Physical Exam Constitutional:      Appearance: Normal appearance. He is obese.  Cardiovascular:     Rate and Rhythm: Normal rate and regular rhythm.     Heart sounds: Normal heart sounds.  Pulmonary:     Effort: Pulmonary effort is normal.     Breath sounds: Normal breath sounds.  Skin:    General: Skin is warm and dry.  Neurological:     General: No focal deficit present.     Mental Status: He is alert and oriented to person, place, and time. Mental status is at baseline.  Psychiatric:        Mood and Affect: Mood normal.        Behavior: Behavior normal.       No results found for any visits on 11/25/19.  Assessment & Plan    1. Essential hypertension  Increase amlodipine to 10 mg. Advised on importance of making lifestyle changes. May need additional medication.   - amLODipine (NORVASC) 10 MG tablet; Take 1 tablet (10 mg total) by mouth daily.  Dispense: 90 tablet; Refill: 0  2. Hyperglycemia  - HgB A1c  3. Morbid obesity (HCC)     No follow-ups on file.      ITrey Sailors, PA-C, have reviewed all documentation for this visit. The documentation on 11/25/19 for the exam, diagnosis, procedures, and orders are all accurate and complete.  The entirety of the information documented in the History of Present Illness, Review of Systems and Physical Exam were personally obtained by me. Portions of this information were initially documented by [facility] and reviewed by me for thoroughness and accuracy.     Paulene Floor  [facility] [phone] (phone) [phone] (fax)  [place]

## 2019-11-25 ENCOUNTER — Ambulatory Visit (INDEPENDENT_AMBULATORY_CARE_PROVIDER_SITE_OTHER): Payer: Self-pay | Admitting: Physician Assistant

## 2019-11-25 ENCOUNTER — Other Ambulatory Visit: Payer: Self-pay

## 2019-11-25 ENCOUNTER — Encounter: Payer: Self-pay | Admitting: Physician Assistant

## 2019-11-25 VITALS — BP 164/104 | HR 100 | Temp 98.3°F | Wt 307.4 lb

## 2019-11-25 DIAGNOSIS — I10 Essential (primary) hypertension: Secondary | ICD-10-CM

## 2019-11-25 DIAGNOSIS — R739 Hyperglycemia, unspecified: Secondary | ICD-10-CM

## 2019-11-25 MED ORDER — AMLODIPINE BESYLATE 10 MG PO TABS: 10.0000 mg | ORAL_TABLET | Freq: Every day | ORAL | 0 refills | Status: AC

## 2019-12-09 ENCOUNTER — Ambulatory Visit: Payer: Self-pay | Admitting: Physician Assistant

## 2019-12-14 ENCOUNTER — Ambulatory Visit: Payer: Self-pay | Admitting: Physician Assistant

## 2019-12-14 NOTE — Progress Notes (Deleted)
     Established patient visit   Patient: Jack Washington   DOB: 1965/04/16   54 y.o. Male  MRN: [id number] Visit Date: 12/14/2019  Today's healthcare provider: Trey Sailors, PA-C   No chief complaint on file.  Subjective    HPI  Hypertension, follow-up  BP Readings from Last 3 Encounters:  11/25/19 (!) 164/104  11/16/19 (!) 176/122  07/31/18 (!) 132/105   Wt Readings from Last 3 Encounters:  11/25/19 (!) 307 lb 6.4 oz (139.4 kg)  11/16/19 300 lb (136.1 kg)  08/04/18 280 lb (127 kg)     He was last seen for hypertension 3 weeks ago.  BP at that visit was 164/104. Management since that visit includes increase Amlodipine to 10 mg, and patient advised on importance of making lifestyle changes.  He reports good compliance with treatment. He is not having side effects.  He is following a {diet:21022986} diet. He {is/is not:9024} exercising. He {does/does not:200015} smoke.  Use of agents associated with hypertension: none.   Outside blood pressures are {***enter patient reported home BP readings, or 'not being checked':1}. Symptoms: No chest pain No chest pressure  No palpitations No syncope  No dyspnea No orthopnea  No paroxysmal nocturnal dyspnea No lower extremity edema   Pertinent labs: No results found for: CHOL, HDL, LDLCALC, LDLDIRECT, TRIG, CHOLHDL Lab Results  Component Value Date   NA 135 11/16/2019   K 3.8 11/16/2019   CREATININE 0.89 11/16/2019   GFRNONAA >60 11/16/2019   GFRAA >60 11/16/2019   GLUCOSE 109 (H) 11/16/2019     The ASCVD Risk score Denman George DC Jr., et al., 2013) failed to calculate for the following reasons:   Cannot find a previous HDL lab   Cannot find a previous total cholesterol lab   ---------------------------------------------------------------------------------------------------   {Show patient history (optional):23778::" "}   Medications: Outpatient Medications Prior to Visit  Medication Sig  . amLODipine (NORVASC) 10 MG  tablet Take 1 tablet (10 mg total) by mouth daily.  Marland Kitchen doxycycline (VIBRAMYCIN) 100 MG capsule Take 1 capsule (100 mg total) by mouth 2 (two) times daily. (Patient not taking: Reported on 11/25/2019)   No facility-administered medications prior to visit.    Review of Systems  Constitutional: Negative.   Respiratory: Negative.   Cardiovascular: Negative.   Musculoskeletal: Negative.     {Heme  Chem  Endocrine  Serology  Results Review (optional):23779::" "}  Objective    There were no vitals taken for this visit. {Show previous vital signs (optional):23777::" "}  Physical Exam  ***  No results found for any visits on 12/14/19.  Assessment & Plan     ***  No follow-ups on file.      {provider attestation***:1}   Maryella Shivers  [place] [phone] (phone) [phone] (fax)  [facility]

## 2020-11-21 ENCOUNTER — Ambulatory Visit: Payer: Self-pay | Admitting: Family Medicine

## 2020-11-29 ENCOUNTER — Other Ambulatory Visit: Payer: Self-pay

## 2020-11-29 ENCOUNTER — Encounter: Payer: Self-pay | Admitting: Family Medicine

## 2020-11-29 ENCOUNTER — Ambulatory Visit (INDEPENDENT_AMBULATORY_CARE_PROVIDER_SITE_OTHER): Payer: PRIVATE HEALTH INSURANCE | Admitting: Family Medicine

## 2020-11-29 VITALS — BP 141/82 | HR 92 | Temp 99.0°F | Wt 289.0 lb

## 2020-11-29 DIAGNOSIS — I10 Essential (primary) hypertension: Secondary | ICD-10-CM | POA: Diagnosis not present

## 2020-11-29 DIAGNOSIS — F310 Bipolar disorder, current episode hypomanic: Secondary | ICD-10-CM

## 2020-11-29 DIAGNOSIS — F419 Anxiety disorder, unspecified: Secondary | ICD-10-CM

## 2020-11-29 DIAGNOSIS — R739 Hyperglycemia, unspecified: Secondary | ICD-10-CM

## 2020-11-29 MED ORDER — ARIPIPRAZOLE 5 MG PO TABS: 5.0000 mg | ORAL_TABLET | Freq: Every day | ORAL | 2 refills | Status: AC

## 2020-11-29 NOTE — Progress Notes (Signed)
Established patient visit   Patient: Jack Washington   DOB: Aug 03, 1965   55 y.o. Male  MRN: [id number] Visit Date: 11/29/2020  Today's healthcare provider: Megan Mans, MD   Chief Complaint  Patient presents with   Follow-up    ER follow up    Anxiety   Subjective    Anxiety Presents for initial visit. The problem has been gradually worsening. Symptoms include decreased concentration, excessive worry, insomnia, nausea, nervous/anxious behavior, palpitations, panic and shortness of breath (worse at night). Patient reports no chest pain, depressed mood, dizziness or restlessness.   This is a first time I have ever seen this nice gentleman and his wife for changes in mood and behavior over the past year but specifically over the past 2 months.  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  Over the past 2 months he has become more irritable and at times irrational.  He has trouble sleeping.  He states he has had a couple of outbursts of anger such as road rage which are not typical for him.  His wife is in agreement with these statements.  He admits to depression and anxiety but denies any suicidal or homicidal ideation.  There is a family history of bipolar disorder in his grandmother. He was seen in the ED for dyspnea and was thought to have anxiety.  Work-up with labs was negative and he is seen cardiology for work-up for palpitations.  Sleep study is also ordered. HPI     Follow-up    Additional comments: ER follow up       Last edited by Paschal Dopp, CMA on 11/29/2020  3:47 PM.      Follow up ER visit  Patient was seen in ER for SOB on 11/15/2020. He was treated for SOB, Chest pain. Treatment for this included; see notes in chart.  Cardiology referral on 11/24/2020, Sleep Study 01/17/2021. He reports excellent compliance with treatment.   He reports this condition is  Unchanged.  -----------------------------------------------------------------------------------------  Depression screen [facility] 2/9 11/29/2020 11/25/2019  Decreased Interest 2 0  Down, Depressed, Hopeless 3 0  PHQ - 2 Score 5 0  Altered sleeping 3 3  Tired, decreased energy 3 3  Change in appetite 2 0  Feeling bad or failure about yourself  3 1  Trouble concentrating 1 0  Moving slowly or fidgety/restless 2 0  Suicidal thoughts 2 0  PHQ-9 Score 21 7  Difficult doing work/chores Extremely dIfficult Somewhat difficult   GAD 7 : Generalized Anxiety Score 11/29/2020  Nervous, Anxious, on Edge 3  Control/stop worrying 3  Worry too much - different things 3  Trouble relaxing 3  Restless 3  Easily annoyed or irritable 3  Afraid - awful might happen 3  Total GAD 7 Score 21  Anxiety Difficulty Extremely difficult        Medications: Outpatient Medications Prior to Visit  Medication Sig   amLODipine (NORVASC) 10 MG tablet Take 1 tablet (10 mg total) by mouth daily. (Patient not taking: Reported on 11/29/2020)   doxycycline (VIBRAMYCIN) 100 MG capsule Take 1 capsule (100 mg total) by mouth 2 (two) times daily.   No facility-administered medications prior to visit.    Review of Systems  Constitutional:  Negative for appetite change, chills and fever.  Respiratory:  Positive for apnea and shortness of breath (worse at night). Negative for cough, chest tightness and wheezing.  Cardiovascular:  Positive for palpitations. Negative for chest pain.  Gastrointestinal:  Positive for abdominal pain ("Muscle cramping"), nausea and vomiting. Negative for constipation and diarrhea.  Neurological:  Negative for dizziness, light-headedness and headaches.  Psychiatric/Behavioral:  Positive for decreased concentration. The patient is nervous/anxious and has insomnia.       Objective    BP (!) 141/82 (BP Location: Right Arm, Patient Position: Sitting, Cuff Size: Large)   Pulse 92   Temp 99 F  (37.2 C) (Oral)   Wt 289 lb (131.1 kg)   SpO2 100%   BMI 43.94 kg/m    Physical Exam Vitals reviewed.  Constitutional:      General: He is not in acute distress.    Appearance: He is well-developed.  HENT:     Head: Normocephalic and atraumatic.     Right Ear: Hearing normal.     Left Ear: Hearing normal.     Nose: Nose normal.  Eyes:     General: Lids are normal. No scleral icterus.       Right eye: No discharge.        Left eye: No discharge.     Conjunctiva/sclera: Conjunctivae normal.  Cardiovascular:     Rate and Rhythm: Normal rate and regular rhythm.     Heart sounds: Normal heart sounds.  Pulmonary:     Effort: Pulmonary effort is normal. No respiratory distress.  Skin:    Findings: No lesion or rash.  Neurological:     General: No focal deficit present.     Mental Status: He is alert and oriented to person, place, and time.  Psychiatric:        Mood and Affect: Mood normal.        Speech: Speech normal.        Behavior: Behavior normal.        Thought Content: Thought content normal.        Judgment: Judgment normal.      No results found for any visits on 11/29/20.  Assessment & Plan     1. Anxiety Chronic anxiety and depressive issues which have worsened over the past couple of months. - Ambulatory referral to Psychiatry  2. Bipolar affective disorder, current episode hypomanic (HCC) I am concerned about treating patient with an SSRI as I think you might have bipolar disorder.  We will start with Abilify 5 mg nightly and refer to psychiatry.More than 50% 25 minute visit spent in counseling or coordination of care  He will need follow-up.  A couple of months.  3. Essential hypertension Fair control today.  4. Hyperglycemia Follow-up with A1c when appropriate.  5. Morbid obesity (HCC) Diet and exercise will be necessary long-term   No follow-ups on file.      I, Megan Mans, MD, have reviewed all documentation for this visit. The  documentation on 11/30/20 for the exam, diagnosis, procedures, and orders are all accurate and complete.    Leeah Politano Wendelyn Breslow, MD  [facility] [phone] (phone) [phone] (fax)  [facility]

## 2020-12-07 ENCOUNTER — Ambulatory Visit: Payer: Self-pay | Admitting: *Deleted

## 2020-12-07 NOTE — Telephone Encounter (Signed)
Pt spouse Francene Finders requests call back from a nurse because the medication that pt was prescribed may need to be adjusted. Tammy requests call back at either [phone] or [phone].

## 2020-12-08 ENCOUNTER — Telehealth: Payer: Self-pay | Admitting: Physician Assistant

## 2020-12-08 ENCOUNTER — Telehealth: Payer: Self-pay | Admitting: Family Medicine

## 2020-12-08 DIAGNOSIS — F310 Bipolar disorder, current episode hypomanic: Secondary | ICD-10-CM

## 2020-12-08 DIAGNOSIS — F419 Anxiety disorder, unspecified: Secondary | ICD-10-CM

## 2020-12-08 NOTE — Telephone Encounter (Signed)
Pts wife Jack Washington is calling on behalf of the pt stating that the medication for ARIPiprazole () 5 MG tablet [[id number]]WIO not been helping the patient. Pt was seen in office on 11/29/20 for anxiety.  Wife reports that the pt is depressed, with mood swings, and feeling of worthlessness. Pt does not have feels of hurting or harming himself or others.  As discussed in the office with Dr. Sullivan Lone, Wife is requesting referral to psychiatry ASAP.  Please advise with wife [phone] Or Patient - [phone]

## 2020-12-11 ENCOUNTER — Ambulatory Visit: Payer: Self-pay | Admitting: Cardiology

## 2020-12-11 NOTE — Telephone Encounter (Signed)
Patient advised as below. Patient requesting work note to be excused for an additional 2-3 days to see how the medication change with help.

## 2020-12-12 ENCOUNTER — Encounter: Payer: Self-pay | Admitting: *Deleted

## 2020-12-12 NOTE — Telephone Encounter (Signed)
Left detailed message on patient's vm advising letter is ready. Per Dr. Sullivan Lone patient has been written out of until 12/18/2020.

## 2020-12-18 ENCOUNTER — Telehealth: Payer: Self-pay

## 2020-12-18 ENCOUNTER — Encounter: Payer: Self-pay | Admitting: *Deleted

## 2020-12-18 NOTE — Telephone Encounter (Signed)
Copied from CRM [id number]. Topic: General - Other >> Dec 18, 2020 11:12 AM Traci Sermon wrote: Reason for CRM: Pts wife called in stating the note the pt has to return to work, his job will not accept it unless it says he is able to return with no restrictions, pt requested if he could get another copy with those instructions, please advise.

## 2020-12-18 NOTE — Telephone Encounter (Signed)
Letter is ready to pick up. Left detailed message on vm advising pt.

## 2021-02-01 ENCOUNTER — Ambulatory Visit: Payer: PRIVATE HEALTH INSURANCE | Admitting: Physician Assistant

## 2022-02-08 ENCOUNTER — Ambulatory Visit (INDEPENDENT_AMBULATORY_CARE_PROVIDER_SITE_OTHER): Payer: Self-pay | Admitting: Physician Assistant

## 2022-02-08 ENCOUNTER — Encounter: Payer: Self-pay | Admitting: Physician Assistant

## 2022-02-08 VITALS — BP 133/96 | HR 95 | Temp 99.1°F | Resp 16 | Ht 69.0 in | Wt 290.0 lb

## 2022-02-08 DIAGNOSIS — R051 Acute cough: Secondary | ICD-10-CM

## 2022-02-08 DIAGNOSIS — J101 Influenza due to other identified influenza virus with other respiratory manifestations: Secondary | ICD-10-CM

## 2022-02-08 LAB — POCT INFLUENZA A/B
Influenza A, POC: POSITIVE — AB
Influenza B, POC: NEGATIVE

## 2022-02-08 LAB — POC COVID19 BINAXNOW: SARS Coronavirus 2 Ag: NEGATIVE

## 2022-02-08 MED ORDER — OSELTAMIVIR PHOSPHATE 75 MG PO CAPS
75.0000 mg | ORAL_CAPSULE | Freq: Two times a day (BID) | ORAL | 0 refills | Status: AC
Start: 2022-02-08 — End: ?

## 2022-02-08 NOTE — Progress Notes (Signed)
      Established patient visit   Patient: Jack Washington   DOB: [birth year]   56 y.o. Male  MRN: [id number] Visit Date: 02/08/2022  Today's healthcare provider: Alfredia Ferguson, PA-C   I,Tiffany J Bragg,acting as a scribe for Alfredia Ferguson, PA-C.,have documented all relevant documentation on the behalf of Alfredia Ferguson, PA-C,as directed by  Alfredia Ferguson, PA-C while in the presence of Alfredia Ferguson, PA-C.   Chief Complaint  Patient presents with   Cough    Patient complains of weakness, fatigue, and cough starting 2 days ago. States wife has RSV.    Subjective    HPI  Pt is a 56 y/o male who reports with weakness, fatigue, cough x 2 days. Reports sick contact-- his wife was dx with RSV. Denies fever, wheezing, SOB.   Medications: Outpatient Medications Prior to Visit  Medication Sig   amLODipine (NORVASC) 10 MG tablet Take 1 tablet (10 mg total) by mouth daily.   ARIPiprazole (ABILIFY) 5 MG tablet Take 1 tablet (5 mg total) by mouth daily.   doxycycline (VIBRAMYCIN) 100 MG capsule Take 1 capsule (100 mg total) by mouth 2 (two) times daily.   No facility-administered medications prior to visit.    Review of Systems  Constitutional:  Positive for fatigue. Negative for fever.  HENT:  Positive for congestion.   Respiratory:  Positive for cough. Negative for shortness of breath.   Cardiovascular:  Negative for chest pain, palpitations and leg swelling.  Neurological:  Negative for dizziness and headaches.      Objective    BP (!) 133/96 (BP Location: Right Arm, Patient Position: Sitting, Cuff Size: Large)   Pulse 95   Temp 99.1 F (37.3 C) (Oral)   Resp 16   Ht 5\' 9"  (1.753 m)   Wt 290 lb (131.5 kg)   SpO2 99%   BMI 42.83 kg/m   Physical Exam Constitutional:      General: He is awake.     Appearance: He is well-developed.  HENT:     Head: Normocephalic.  Eyes:     Conjunctiva/sclera: Conjunctivae normal.  Cardiovascular:     Rate and Rhythm: Normal rate  and regular rhythm.     Heart sounds: Normal heart sounds.  Pulmonary:     Effort: Pulmonary effort is normal.     Breath sounds: Normal breath sounds.  Skin:    General: Skin is warm.  Neurological:     Mental Status: He is alert and oriented to person, place, and time.  Psychiatric:        Attention and Perception: Attention normal.        Mood and Affect: Mood normal.        Speech: Speech normal.        Behavior: Behavior is cooperative.     No results found for any visits on 02/08/22.  Assessment & Plan     Influenza A Poc covid neg poc flu A Discussed tamiflu for treatment, prescribed 75 mg BID x 5 days  Increase fluids, mucinex, tylenol.  Return if symptoms worsen or fail to improve.     I, 02/10/22, PA-C have reviewed all documentation for this visit. The documentation on  02/08/2022 for the exam, diagnosis, procedures, and orders are all accurate and complete.  02/10/2022, PA-C [facility] [address] [place], [place], [place] Office: [phone] Fax: [phone]   [facility]

## 2022-06-10 IMAGING — CR DG CHEST 2V
2 series · 2 of 2 positions shown · non-contrast
Comparison: 08/19/2018

CLINICAL DATA: Shortness of breath

EXAM:
CHEST - 2 VIEW

[chest pa]
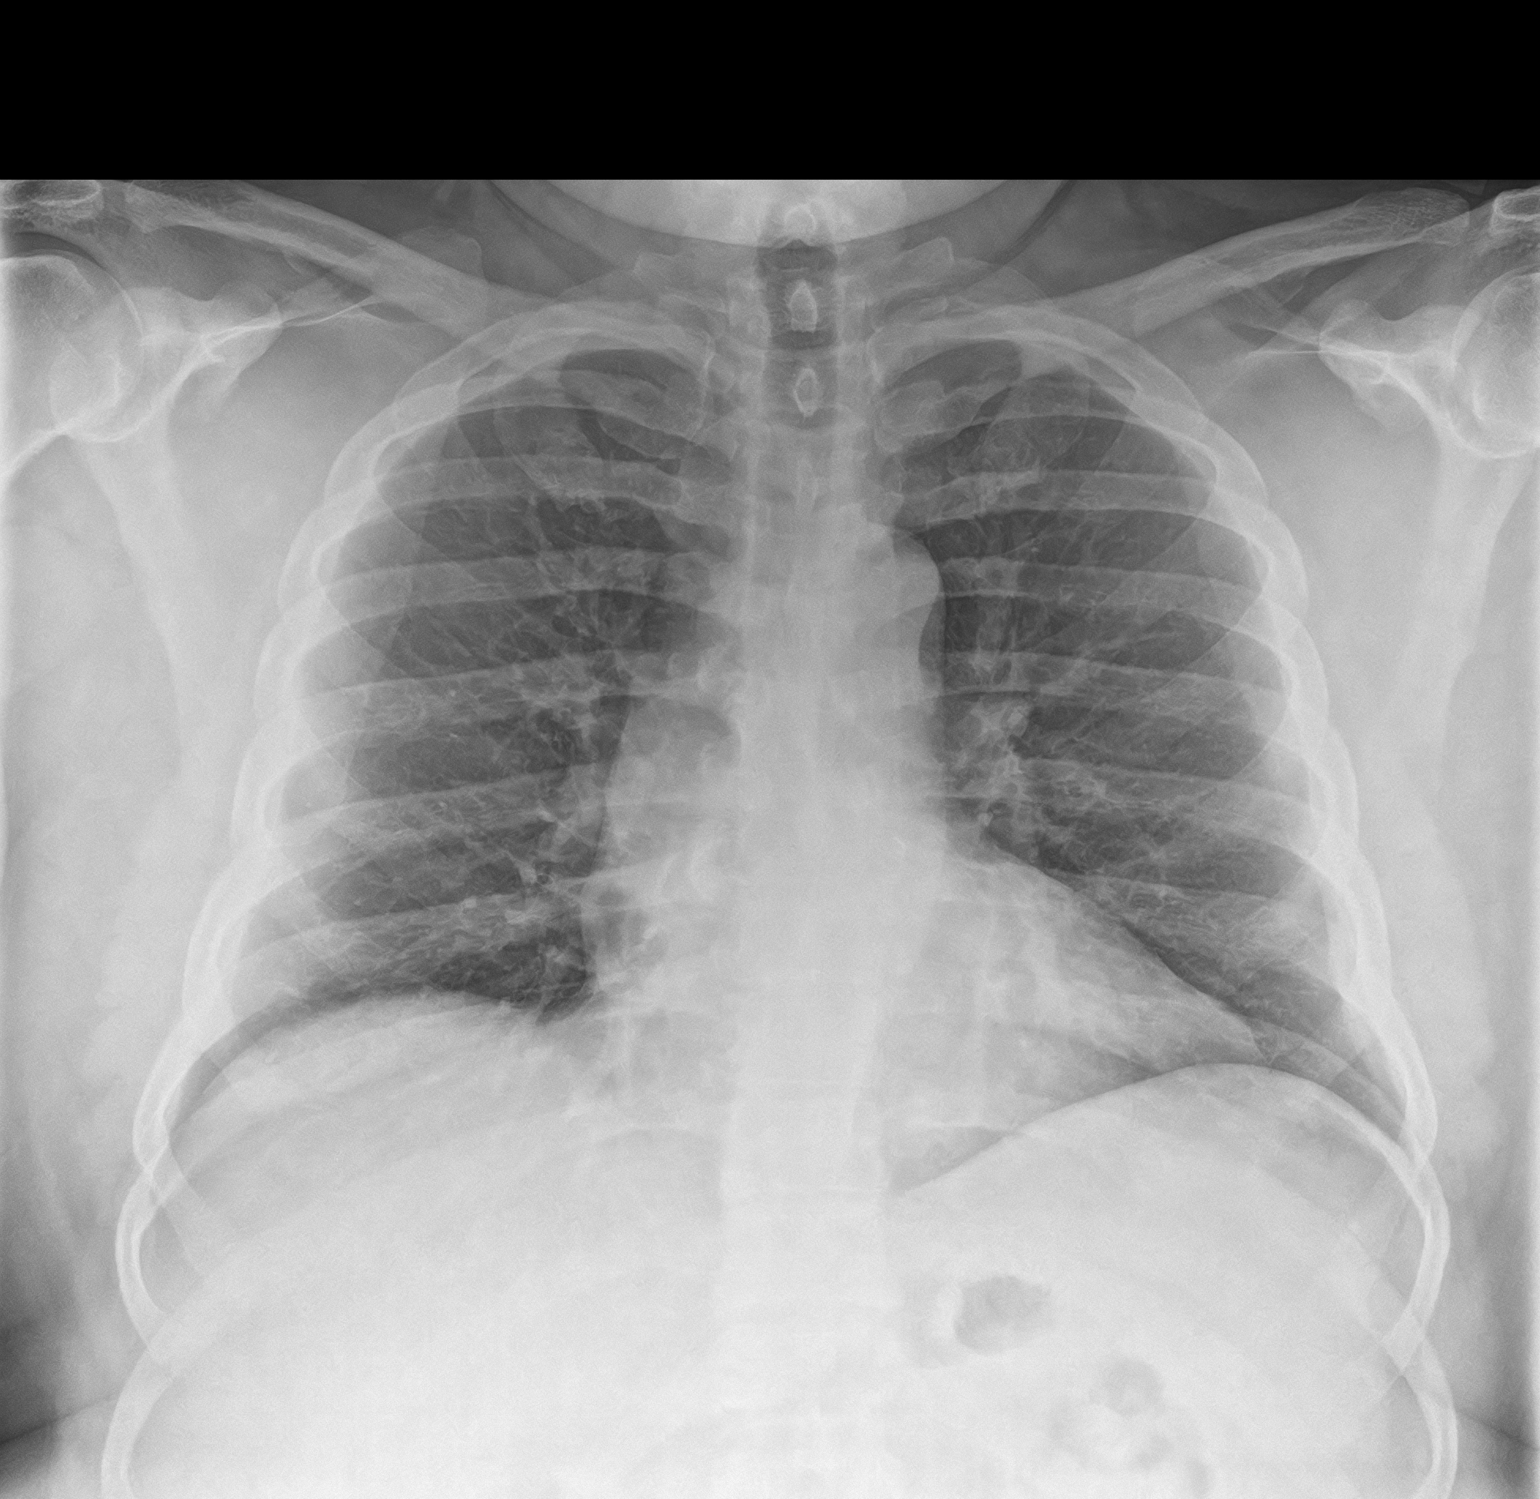

[chest lat]
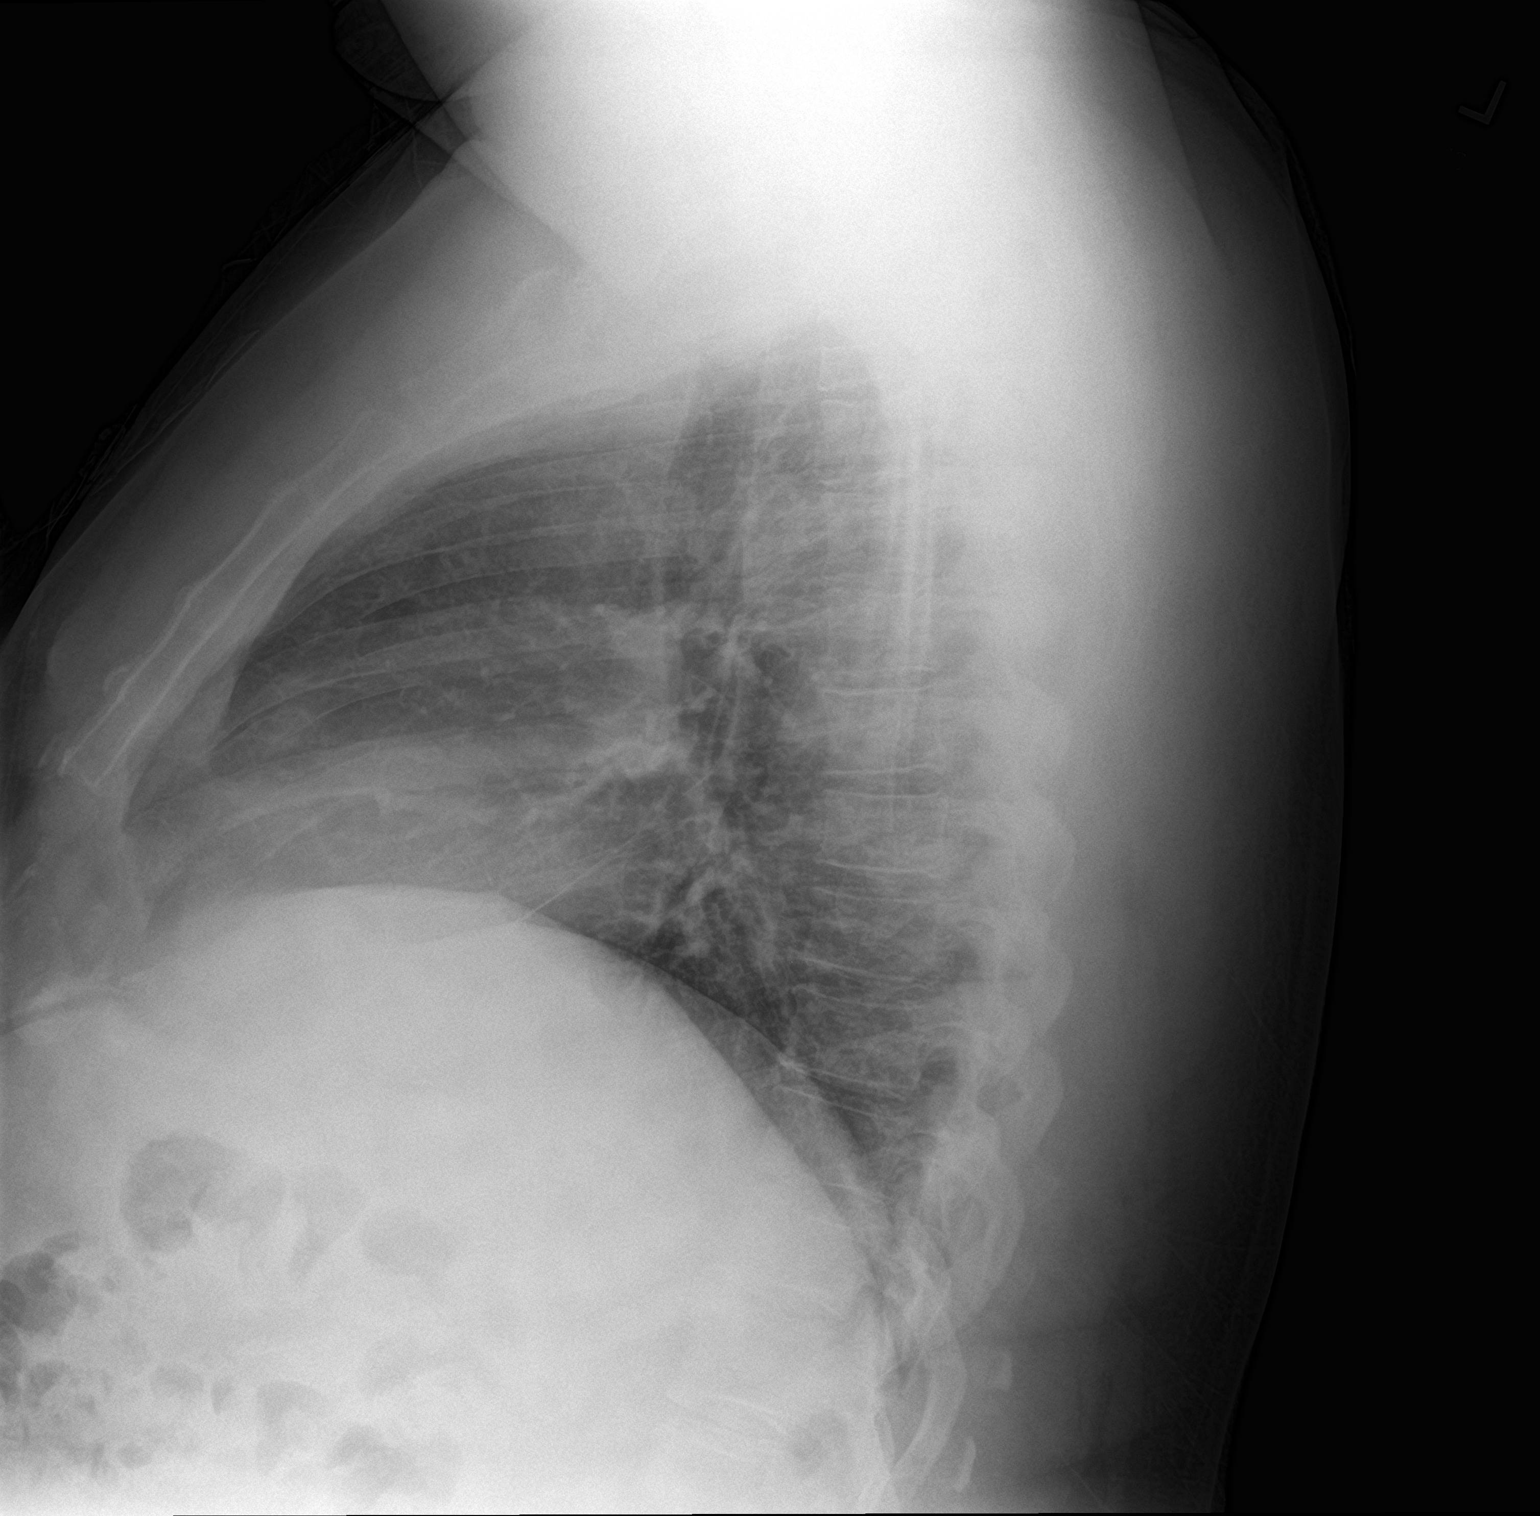

[2 of 2 positions shown; findings below may reference images not displayed]

FINDINGS: Cardiac shadow is within normal limits. The lungs are well aerated
bilaterally. No focal infiltrate or sizable effusion is seen. No
bony abnormality is noted.
IMPRESSION: No active cardiopulmonary disease.
